# Patient Record
Sex: Male | Born: 1960 | Race: White | Hispanic: No | Smoking: Former smoker
Health system: Southern US, Community
[De-identification: ages and names within clinical notes are randomized; demographics above are authoritative.]

## PROBLEM LIST (undated history)

## (undated) DIAGNOSIS — K219 Gastro-esophageal reflux disease without esophagitis: Secondary | ICD-10-CM

## (undated) HISTORY — DX: Gastro-esophageal reflux disease without esophagitis: K21.9

---

## 2021-02-12 DIAGNOSIS — Z139 Encounter for screening, unspecified: Secondary | ICD-10-CM

## 2021-02-12 LAB — GLUCOSE, POCT (MANUAL RESULT ENTRY): POC Glucose: 107 mg/dl — AB (ref 70–99)

## 2021-02-12 NOTE — Congregational Nurse Program (Signed)
Dept: [phone]   Congregational Nurse Program Note  Date of Encounter: 02/12/2021  Past Medical History: No past medical history on file.  Encounter Details:  CNP Questionnaire - 02/12/21 1000      Questionnaire   Do you give verbal consent to treat you today? Yes    Visit Setting Other    Location Patient Served At [facility]    Patient Status Not Applicable    Medical Provider No    Insurance Uninsured (Includes [facility] [place])    Intervention Advocate;Educate;Refer    Food Within past 12 months, worried food would run out with no money to buy more;Within past 12 months, food ran out with no money to buy more    Referrals Behavioral/Mental Health Provider;Medication Assistance;[facility];PCP - other provider    Screening Referrals Annual Wellness Visit    ED Visit Averted Yes           Client into [facility] today to enroll in [facility], seeking a primary care provider. He recently moved to area a month ago, he is unemployed but states looking for work. He states he has not been to a primary care provider "ever". He does admit emergency room visit 2 years ago for Covid. He is not vaccinated.  Admits to past admissions for mental health last per client 3 years ago.  Past Medical History per client: Hypertension ( no medication ever) Depression GERD (takes over the counter med for acid reflux..unsure of name) History of Alcohol abuse: last alcohol 5 1/2 years ago. Fractured right wrist  Facial fracture left side Joint pain and stiffness  Difficult historian Medications: OTC acid reflux medication   Alert and oriented to person and place, answers appropriately. Client is very hard of hearing. Appears nervous. Gait steady, but reports stiffness and difficulty moving after sitting a period of time. Complaint today; nephew took blood pressure at home and it was "200 over 100 something" Also complains of "problem with my butt" states  he has sores and areas that bleed, denies hemorrhoids. He states he cannot use toilet paper, uses wipes. Notices some blood when he wipes.He reports normal bowel movements, but "never looks at stool, I flush before getting up" he also complains of gas and "very bad smelling bowel movements" He denies chest pains at present, but does reports he has some tightness at times and at some point was told be ER he needed to see a cardiologist, but he does not know why. He reports dyspnea on exertion but no wheezing. 40 plus year smoker quit about 2 years ago, but still uses smokeless tobacco.  Lungs clear, some diminished breath sounds bases. O2 saturation 97% on room air, resp 12 and unlabored at rest. Denies swelling of hands and feet.  Denies headache but does report difficulty with vision reading. He does have some tingling and difficulty holding objects in right hand since wrist fracture, right wrist does appear with deformity, client reports, "they didn't put it back together right". Denies abdominal pain at present.  He reports history of depression and anger problems. He reports being admitted for past suicidal ideations and attempts, last being 3 years ago. Today he denies have SI/HI but admits he does need to be back on medications. He states when he was admitted he was never given prescriptions and same for any ER visits in past. Discussed Mental health services at [facility] gave dates and times for walk in intake M-F and address. Also provided crisis number  as well as Mobile crisis number and discussed that is available 24/7. Client reports he is looking for work, but states he has a "short fuse" and has difficulty at times working with others.  Today: Blood pressure left arm, sitting normal cuff 159/106, pulse91 Oxygen saturation 97, Resp 12, Temp 98.3 orally. Random blood sugar client last ate cereal around 830am  CBG: 107   He does report family history of heart disease and strokes. Discussed signs  and symptoms of stroke and instructed to call 911 for any of those symptoms or chest pain or increased shortness of breath.   Plan: [facility] for 02/13/21 at 0830am client given contact information and address.  Referral to [facility] Services: given address and walk in intake times and days available. Client given crisis numbers.  Plan to follow up after [facility] appointment and follow up regarding getting mental health services.  Care Connect application completed and medassist application sent in today by Eston Mould.   Client agreeable to plan. This RN's contact information given to client.   Francee Nodal RN [employer]

## 2021-02-13 ENCOUNTER — Encounter: Payer: Self-pay | Admitting: Physician Assistant

## 2021-02-13 ENCOUNTER — Other Ambulatory Visit: Payer: Self-pay

## 2021-02-13 ENCOUNTER — Ambulatory Visit: Payer: Self-pay | Admitting: Physician Assistant

## 2021-02-13 VITALS — BP 145/100 | HR 91 | Temp 98.2°F | Ht 66.75 in | Wt 177.0 lb

## 2021-02-13 DIAGNOSIS — Z131 Encounter for screening for diabetes mellitus: Secondary | ICD-10-CM

## 2021-02-13 DIAGNOSIS — I10 Essential (primary) hypertension: Secondary | ICD-10-CM

## 2021-02-13 DIAGNOSIS — R7309 Other abnormal glucose: Secondary | ICD-10-CM

## 2021-02-13 DIAGNOSIS — K602 Anal fissure, unspecified: Secondary | ICD-10-CM

## 2021-02-13 DIAGNOSIS — K625 Hemorrhage of anus and rectum: Secondary | ICD-10-CM

## 2021-02-13 DIAGNOSIS — Z125 Encounter for screening for malignant neoplasm of prostate: Secondary | ICD-10-CM

## 2021-02-13 DIAGNOSIS — Z13220 Encounter for screening for lipoid disorders: Secondary | ICD-10-CM

## 2021-02-13 DIAGNOSIS — Z7689 Persons encountering health services in other specified circumstances: Secondary | ICD-10-CM

## 2021-02-13 DIAGNOSIS — K219 Gastro-esophageal reflux disease without esophagitis: Secondary | ICD-10-CM

## 2021-02-13 MED ORDER — LISINOPRIL 10 MG PO TABS
10.0000 mg | ORAL_TABLET | Freq: Every day | ORAL | 1 refills | Status: DC
Start: 2021-02-13 — End: 2021-03-12

## 2021-02-13 MED ORDER — OMEPRAZOLE 40 MG PO CPDR: 40.0000 mg | DELAYED_RELEASE_CAPSULE | Freq: Every day | ORAL | 1 refills | Status: DC

## 2021-02-13 NOTE — Progress Notes (Signed)
BP (!) 145/100   Pulse 91   Temp 98.2 F (36.8 C)   Ht 5' 6.75" (1.695 m)   Wt 177 lb (80.3 kg)   SpO2 99%   BMI 27.93 kg/m    Subjective:    Patient ID: Trevor Allen, male    DOB: 1961/05/29, 60 y.o.   MRN: [id number]  HPI: Trevor Allen is a 61 y.o. male presenting on 02/13/2021 for No chief complaint on file.   HPI   Pt had a negative covid 19 screening questionnaire.   Pt is a 59yoM who presents to establish care.  He has not had a PCP in a long time.  Pt c/o heartburn that has been bothering him for 4 or 5 years.  He uses OTC acid reducer and has problems if he misses his dose.  He had bleeding ulcers in the 1990s.  He thinks his BP has been high or 2 or 3 years.  He doesn't work.  He last worked about 2 1/2 years ago.  He worked on [employer].  He recently moved back to [place].  Pt c/o rectum problems for about a year.  His BMs are normal most of the time.  He does have times of constipation and diarrhea.  He has blood sometimes when he cleans himself.  He has never had a colonoscopy.    Relevant past medical, surgical, family and social history reviewed and updated as indicated. Interim medical history since our last visit reviewed. Allergies and medications reviewed and updated.  CURRENT MEDS: OTC acid reducer  Review of Systems  Per HPI unless specifically indicated above     Objective:    BP (!) 145/100   Pulse 91   Temp 98.2 F (36.8 C)   Ht 5' 6.75" (1.695 m)   Wt 177 lb (80.3 kg)   SpO2 99%   BMI 27.93 kg/m   Wt Readings from Last 3 Encounters:  02/13/21 177 lb (80.3 kg)    Physical Exam Vitals reviewed. Exam conducted with a chaperone present (c.vaughn).  Constitutional:      Appearance: He is well-developed.  HENT:     Head: Normocephalic and atraumatic.     Right Ear: Tympanic membrane and ear canal normal.     Left Ear: Tympanic membrane and ear canal normal.     Ears:     Comments: Pt is HOH Eyes:     Extraocular  Movements: Extraocular movements intact.     Conjunctiva/sclera: Conjunctivae normal.     Pupils: Pupils are equal, round, and reactive to light.  Neck:     Thyroid: No thyromegaly.  Cardiovascular:     Rate and Rhythm: Normal rate and regular rhythm.  Pulmonary:     Effort: Pulmonary effort is normal.     Breath sounds: Normal breath sounds. No wheezing or rales.  Abdominal:     General: Bowel sounds are normal.     Palpations: Abdomen is soft. There is no hepatomegaly, splenomegaly, mass or pulsatile mass.     Tenderness: There is no abdominal tenderness. There is no guarding or rebound.     Hernia: A hernia is present. Hernia is present in the umbilical area.  Genitourinary:    Rectum: Tenderness and anal fissure present. No external hemorrhoid.  Musculoskeletal:     Cervical back: Neck supple.     Right lower leg: No edema.     Left lower leg: No edema.  Lymphadenopathy:     Cervical: No cervical adenopathy.  Skin:    General: Skin is warm and dry.     Findings: No rash.  Neurological:     Mental Status: He is alert and oriented to person, place, and time.  Psychiatric:        Attention and Perception: Attention normal.        Speech: Speech normal.        Behavior: Behavior normal. Behavior is cooperative.             Assessment & Plan:   Encounter Diagnoses  Name Primary?  . Encounter to establish care Yes  . Primary hypertension   . Gastroesophageal reflux disease, unspecified whether esophagitis present   . Rectal bleeding   . Screening cholesterol level   . Screening for prostate cancer   . Screening for diabetes mellitus   . Elevated glucose      -will get Baseline labs -pt will start lisinopril for htn -rx omeprazole for GERD.  Pt is counseled on lifestyle changes for gerd and given handout.  Will Refer to GI.  Concerning for possible rectal cancer or some other issue in light of the length of his problems. Pt was counseled on skin care and the  need to make sure he keeps the area dry.  He is also counseled to avoid constipation with drinking plenty of water and using OTC stool softener if needed. -pt is given application for [facility] [employer] financial assistance.   -pt is encouraged to get covid vaccination -pt will follow up 1 month to recheck bp and review labs.  He is to contact office sooner prn

## 2021-02-13 NOTE — Patient Instructions (Signed)
[employer]'s Current Therapy 2021 (pp. 213-216). [place], [place]: Elsevier.">  Gastroesophageal Reflux Disease, Adult Gastroesophageal reflux (GER) happens when acid from the stomach flows up into the tube that connects the mouth and the stomach (esophagus). Normally, food travels down the esophagus and stays in the stomach to be digested. However, when a person has GER, food and stomach acid sometimes move back up into the esophagus. If this becomes a more serious problem, the person may be diagnosed with a disease called gastroesophageal reflux disease (GERD). GERD occurs when the reflux:  Happens often.  Causes frequent or severe symptoms.  Causes problems such as damage to the esophagus. When stomach acid comes in contact with the esophagus, the acid may cause inflammation in the esophagus. Over time, GERD may create small holes (ulcers) in the lining of the esophagus. What are the causes? This condition is caused by a problem with the muscle between the esophagus and the stomach (lower esophageal sphincter, or LES). Normally, the LES muscle closes after food passes through the esophagus to the stomach. When the LES is weakened or abnormal, it does not close properly, and that allows food and stomach acid to go back up into the esophagus. The LES can be weakened by certain dietary substances, medicines, and medical conditions, including:  Tobacco use.  Pregnancy.  Having a hiatal hernia.  Alcohol use.  Certain foods and beverages, such as coffee, chocolate, onions, and peppermint. What increases the risk? You are more likely to develop this condition if you:  Have an increased body weight.  Have a connective tissue disorder.  Take NSAIDs, such as ibuprofen. What are the signs or symptoms? Symptoms of this condition include:  Heartburn.  Difficult or painful swallowing and the feeling of having a lump in the throat.  A bitter taste in the mouth.  Bad breath and having a large  amount of saliva.  Having an upset or bloated stomach and belching.  Chest pain. Different conditions can cause chest pain. Make sure you see your health care provider if you experience chest pain.  Shortness of breath or wheezing.  Ongoing (chronic) cough or a nighttime cough.  Wearing away of tooth enamel.  Weight loss. How is this diagnosed? This condition may be diagnosed based on a medical history and a physical exam. To determine if you have mild or severe GERD, your health care provider may also monitor how you respond to treatment. You may also have tests, including:  A test to examine your stomach and esophagus with a small camera (endoscopy).  A test that measures the acidity level in your esophagus.  A test that measures how much pressure is on your esophagus.  A barium swallow or modified barium swallow test to show the shape, size, and functioning of your esophagus. How is this treated? Treatment for this condition may vary depending on how severe your symptoms are. Your health care provider may recommend:  Changes to your diet.  Medicine.  Surgery. The goal of treatment is to help relieve your symptoms and to prevent complications. Follow these instructions at home: Eating and drinking  Follow a diet as recommended by your health care provider. This may involve avoiding foods and drinks such as: ? Coffee and tea, with or without caffeine. ? Drinks that contain alcohol. ? Energy drinks and sports drinks. ? Carbonated drinks or sodas. ? Chocolate and cocoa. ? Peppermint and mint flavorings. ? Garlic and onions. ? Horseradish. ? Spicy and acidic foods, including peppers, chili powder,   curry powder, vinegar, hot sauces, and barbecue sauce. ? Citrus fruit juices and citrus fruits, such as oranges, lemons, and limes. ? Tomato-based foods, such as red sauce, chili, salsa, and pizza with red sauce. ? Fried and fatty foods, such as donuts, french fries, potato  chips, and high-fat dressings. ? High-fat meats, such as hot dogs and fatty cuts of red and white meats, such as rib eye steak, sausage, ham, and bacon. ? High-fat dairy items, such as whole milk, butter, and cream cheese.  Eat small, frequent meals instead of large meals.  Avoid drinking large amounts of liquid with your meals.  Avoid eating meals during the 2-3 hours before bedtime.  Avoid lying down right after you eat.  Do not exercise right after you eat.   Lifestyle  Do not use any products that contain nicotine or tobacco. These products include cigarettes, chewing tobacco, and vaping devices, such as e-cigarettes. If you need help quitting, ask your health care provider.  Try to reduce your stress by using methods such as yoga or meditation. If you need help reducing stress, ask your health care provider.  If you are overweight, reduce your weight to an amount that is healthy for you. Ask your health care provider for guidance about a safe weight loss goal.   General instructions  Pay attention to any changes in your symptoms.  Take over-the-counter and prescription medicines only as told by your health care provider. Do not take aspirin, ibuprofen, or other NSAIDs unless your health care provider told you to take these medicines.  Wear loose-fitting clothing. Do not wear anything tight around your waist that causes pressure on your abdomen.  Raise (elevate) the head of your bed about 6 inches (15 cm). You can use a wedge to do this.  Avoid bending over if this makes your symptoms worse.  Keep all follow-up visits. This is important. Contact a health care provider if:  You have: ? New symptoms. ? Unexplained weight loss. ? Difficulty swallowing or it hurts to swallow. ? Wheezing or a persistent cough. ? A hoarse voice.  Your symptoms do not improve with treatment. Get help right away if:  You have sudden pain in your arms, neck, jaw, teeth, or back.  You  suddenly feel sweaty, dizzy, or light-headed.  You have chest pain or shortness of breath.  You vomit and the vomit is green, yellow, or black, or it looks like blood or coffee grounds.  You faint.  You have stool that is red, bloody, or black.  You cannot swallow, drink, or eat. These symptoms may represent a serious problem that is an emergency. Do not wait to see if the symptoms will go away. Get medical help right away. Call your local emergency services (911 in the [place] Do not drive yourself to the hospital. Summary  Gastroesophageal reflux happens when acid from the stomach flows up into the esophagus. GERD is a disease in which the reflux happens often, causes frequent or severe symptoms, or causes problems such as damage to the esophagus.  Treatment for this condition may vary depending on how severe your symptoms are. Your health care provider may recommend diet and lifestyle changes, medicine, or surgery.  Contact a health care provider if you have new or worsening symptoms.  Take over-the-counter and prescription medicines only as told by your health care provider. Do not take aspirin, ibuprofen, or other NSAIDs unless your health care provider told you to do so.  Keep all follow-up   visits as told by your health care provider. This is important. This information is not intended to replace advice given to you by your health care provider. Make sure you discuss any questions you have with your health care provider. Document Revised: 04/15/2020 Document Reviewed: 04/15/2020 Elsevier Patient Education  2021 Elsevier Inc.  

## 2021-02-14 ENCOUNTER — Other Ambulatory Visit: Payer: Self-pay

## 2021-02-14 ENCOUNTER — Other Ambulatory Visit (HOSPITAL_COMMUNITY)
Admission: RE | Admit: 2021-02-14 | Discharge: 2021-02-14 | Disposition: A | Discharge status: Home / Self Care | Payer: Self-pay | Source: Ambulatory Visit | Attending: Physician Assistant | Admitting: Physician Assistant

## 2021-02-14 DIAGNOSIS — K625 Hemorrhage of anus and rectum: Secondary | ICD-10-CM

## 2021-02-14 DIAGNOSIS — Z131 Encounter for screening for diabetes mellitus: Secondary | ICD-10-CM

## 2021-02-14 DIAGNOSIS — Z13220 Encounter for screening for lipoid disorders: Secondary | ICD-10-CM

## 2021-02-14 DIAGNOSIS — R7309 Other abnormal glucose: Secondary | ICD-10-CM

## 2021-02-14 DIAGNOSIS — I10 Essential (primary) hypertension: Secondary | ICD-10-CM

## 2021-02-14 DIAGNOSIS — Z125 Encounter for screening for malignant neoplasm of prostate: Secondary | ICD-10-CM

## 2021-02-14 DIAGNOSIS — K219 Gastro-esophageal reflux disease without esophagitis: Secondary | ICD-10-CM

## 2021-02-14 LAB — COMPREHENSIVE METABOLIC PANEL
ALT: 27 U/L (ref 0–44)
AST: 21 U/L (ref 15–41)
Albumin: 4.1 g/dL (ref 3.5–5.0)
Alkaline Phosphatase: 84 U/L (ref 38–126)
Anion gap: 8 (ref 5–15)
BUN: 23 mg/dL — ABNORMAL HIGH (ref 6–20)
CO2: 26 mmol/L (ref 22–32)
Calcium: 9.7 mg/dL (ref 8.9–10.3)
Chloride: 99 mmol/L (ref 98–111)
Creatinine, Ser: 1.04 mg/dL (ref 0.61–1.24)
GFR, Estimated: >60 mL/min (ref >60)
Glucose, Bld: 90 mg/dL (ref 70–99)
Potassium: 4 mmol/L (ref 3.5–5.1)
Sodium: 133 mmol/L — ABNORMAL LOW (ref 135–145)
Total Bilirubin: 0.4 mg/dL (ref 0.3–1.2)
Total Protein: 8 g/dL (ref 6.5–8.1)

## 2021-02-14 LAB — CBC
HCT: 46.1 % (ref 39.0–52.0)
Hemoglobin: 15.7 g/dL (ref 13.0–17.0)
MCH: 31.7 pg (ref 26.0–34.0)
MCHC: 34.1 g/dL (ref 30.0–36.0)
MCV: 93.1 fL (ref 80.0–100.0)
Platelets: 321 10*3/uL (ref 150–400)
RBC: 4.95 MIL/uL (ref 4.22–5.81)
RDW: 11.9 % (ref 11.5–15.5)
WBC: 9.2 10*3/uL (ref 4.0–10.5)
nRBC: 0 % (ref 0.0–0.2)

## 2021-02-14 LAB — HEMOGLOBIN A1C
Hgb A1c MFr Bld: 5.7 % — ABNORMAL HIGH (ref 4.8–5.6)
Mean Plasma Glucose: 116.89 mg/dL

## 2021-02-14 LAB — LIPID PANEL
Cholesterol: 206 mg/dL — ABNORMAL HIGH (ref 0–200)
HDL: 47 mg/dL (ref >40)
LDL Cholesterol: 139 mg/dL — ABNORMAL HIGH (ref 0–99)
Total CHOL/HDL Ratio: 4.4 RATIO
Triglycerides: 99 mg/dL (ref <150)
VLDL: 20 mg/dL (ref 0–40)

## 2021-02-14 LAB — PSA: Prostatic Specific Antigen: 1.28 ng/mL (ref 0.00–4.00)

## 2021-02-17 ENCOUNTER — Ambulatory Visit: Payer: Self-pay | Admitting: Physician Assistant

## 2021-02-18 ENCOUNTER — Encounter: Payer: Self-pay | Admitting: Internal Medicine

## 2021-03-12 ENCOUNTER — Encounter: Payer: Self-pay | Admitting: Physician Assistant

## 2021-03-12 ENCOUNTER — Ambulatory Visit: Payer: Self-pay | Admitting: Physician Assistant

## 2021-03-12 VITALS — BP 140/95 | HR 87 | Temp 96.7°F

## 2021-03-12 DIAGNOSIS — E785 Hyperlipidemia, unspecified: Secondary | ICD-10-CM

## 2021-03-12 DIAGNOSIS — I10 Essential (primary) hypertension: Secondary | ICD-10-CM

## 2021-03-12 MED ORDER — LISINOPRIL 20 MG PO TABS: 20.0000 mg | ORAL_TABLET | Freq: Every day | ORAL | 3 refills | Status: DC

## 2021-03-12 MED ORDER — ATORVASTATIN CALCIUM 20 MG PO TABS: 20.0000 mg | ORAL_TABLET | Freq: Every day | ORAL | 3 refills | Status: DC

## 2021-03-12 NOTE — Patient Instructions (Signed)
High Cholesterol  High cholesterol is a condition in which the blood has high levels of a white, waxy substance similar to fat (cholesterol). The liver makes all the cholesterol that the body needs. The human body needs small amounts of cholesterol to help build cells. A person gets extra or excess cholesterol from the food that he or she eats. The blood carries cholesterol from the liver to the rest of the body. If you have high cholesterol, deposits (plaques) may build up on the walls of your arteries. Arteries are the blood vessels that carry blood away from your heart. These plaques make the arteries narrow and stiff. Cholesterol plaques increase your risk for heart attack and stroke. Work with your health care provider to keep your cholesterol levels in a healthy range. What increases the risk? The following factors may make you more likely to develop this condition:  Eating foods that are high in animal fat (saturated fat) or cholesterol.  Being overweight.  Not getting enough exercise.  A family history of high cholesterol (familial hypercholesterolemia).  Use of tobacco products.  Having diabetes. What are the signs or symptoms? There are no symptoms of this condition. How is this diagnosed? This condition may be diagnosed based on the results of a blood test.  If you are older than 59 years of age, your health care provider may check your cholesterol levels every 4-6 years.  You may be checked more often if you have high cholesterol or other risk factors for heart disease. The blood test for cholesterol measures:  "Bad" cholesterol, or LDL cholesterol. This is the main type of cholesterol that causes heart disease. The desired level is less than 100 mg/dL.  "Good" cholesterol, or HDL cholesterol. HDL helps protect against heart disease by cleaning the arteries and carrying the LDL to the liver for processing. The desired level for HDL is 60 mg/dL or higher.  Triglycerides.  These are fats that your body can store or burn for energy. The desired level is less than 150 mg/dL.  Total cholesterol. This measures the total amount of cholesterol in your blood and includes LDL, HDL, and triglycerides. The desired level is less than 200 mg/dL. How is this treated? This condition may be treated with:  Diet changes. You may be asked to eat foods that have more fiber and less saturated fats or added sugar.  Lifestyle changes. These may include regular exercise, maintaining a healthy weight, and quitting use of tobacco products.  Medicines. These are given when diet and lifestyle changes have not worked. You may be prescribed a statin medicine to help lower your cholesterol levels. Follow these instructions at home: Eating and drinking  Eat a healthy, balanced diet. This diet includes: ? Daily servings of a variety of fresh, frozen, or canned fruits and vegetables. ? Daily servings of whole grain foods that are rich in fiber. ? Foods that are low in saturated fats and trans fats. These include poultry and fish without skin, lean cuts of meat, and low-fat dairy products. ? A variety of fish, especially oily fish that contain omega-3 fatty acids. Aim to eat fish at least 2 times a week.  Avoid foods and drinks that have added sugar.  Use healthy cooking methods, such as roasting, grilling, broiling, baking, poaching, steaming, and stir-frying. Do not fry your food except for stir-frying.   Lifestyle  Get regular exercise. Aim to exercise for a total of 150 minutes a week. Increase your activity level by doing activities   such as gardening, walking, and taking the stairs.  Do not use any products that contain nicotine or tobacco, such as cigarettes, e-cigarettes, and chewing tobacco. If you need help quitting, ask your health care provider.   General instructions  Take over-the-counter and prescription medicines only as told by your health care provider.  Keep all  follow-up visits as told by your health care provider. This is important. Where to find more information  American Heart Association: www.heart.org  National Heart, Lung, and Blood Institute: www.nhlbi.nih.gov Contact a health care provider if:  You have trouble achieving or maintaining a healthy diet or weight.  You are starting an exercise program.  You are unable to stop smoking. Get help right away if:  You have chest pain.  You have trouble breathing.  You have any symptoms of a stroke. "BE FAST" is an easy way to remember the main warning signs of a stroke: ? B - Balance. Signs are dizziness, sudden trouble walking, or loss of balance. ? E - Eyes. Signs are trouble seeing or a sudden change in vision. ? F - Face. Signs are sudden weakness or numbness of the face, or the face or eyelid drooping on one side. ? A - Arms. Signs are weakness or numbness in an arm. This happens suddenly and usually on one side of the body. ? S - Speech. Signs are sudden trouble speaking, slurred speech, or trouble understanding what people say. ? T - Time. Time to call emergency services. Write down what time symptoms started.  You have other signs of a stroke, such as: ? A sudden, severe headache with no known cause. ? Nausea or vomiting. ? Seizure. These symptoms may represent a serious problem that is an emergency. Do not wait to see if the symptoms will go away. Get medical help right away. Call your local emergency services (911 in the [place] Do not drive yourself to the hospital. Summary  Cholesterol plaques increase your risk for heart attack and stroke. Work with your health care provider to keep your cholesterol levels in a healthy range.  Eat a healthy, balanced diet, get regular exercise, and maintain a healthy weight.  Do not use any products that contain nicotine or tobacco, such as cigarettes, e-cigarettes, and chewing tobacco.  Get help right away if you have any symptoms of a  stroke. This information is not intended to replace advice given to you by your health care provider. Make sure you discuss any questions you have with your health care provider. Document Revised: 09/04/2019 Document Reviewed: 09/04/2019 Elsevier Patient Education  2021 Elsevier Inc.  

## 2021-03-12 NOTE — Progress Notes (Signed)
BP (!) 140/95   Pulse 87   Temp (!) 96.7 F (35.9 C)   SpO2 98%    Subjective:    Patient ID: Trevor Allen, male    DOB: February 12, 1961, 60 y.o.   MRN: [id number]  HPI: Trevor Allen is a 60 y.o. male presenting on 03/12/2021 for No chief complaint on file.   HPI   Pt had a negative covid 19 screening questionnaire.    Pt is 59yoM who presented to office last month to establish care.    He says he submitted his [employer] financial assistance application to [facility].  He was referred to GI at his appointment due to rectal bleeding.    He has appointment with GI in June.      Relevant past medical, surgical, family and social history reviewed and updated as indicated. Interim medical history since our last visit reviewed. Allergies and medications reviewed and updated.   Current Outpatient Medications:  .  lisinopril (ZESTRIL) 10 MG tablet, Take 1 tablet (10 mg total) by mouth daily., Disp: 30 tablet, Rfl: 1 .  omeprazole (PRILOSEC) 40 MG capsule, Take 1 capsule (40 mg total) by mouth daily., Disp: 30 capsule, Rfl: 1     Review of Systems  Per HPI unless specifically indicated above     Objective:    BP (!) 140/95   Pulse 87   Temp (!) 96.7 F (35.9 C)   SpO2 98%   Wt Readings from Last 3 Encounters:  02/13/21 177 lb (80.3 kg)    Physical Exam Vitals reviewed.  Constitutional:      General: He is not in acute distress.    Appearance: He is well-developed. He is not toxic-appearing.  HENT:     Head: Normocephalic and atraumatic.  Cardiovascular:     Rate and Rhythm: Normal rate and regular rhythm.  Pulmonary:     Effort: Pulmonary effort is normal.     Breath sounds: Normal breath sounds. No wheezing.  Abdominal:     General: Bowel sounds are normal.     Palpations: Abdomen is soft.     Tenderness: There is no abdominal tenderness.  Musculoskeletal:     Cervical back: Neck supple.     Right lower leg: No edema.     Left lower leg: No edema.   Lymphadenopathy:     Cervical: No cervical adenopathy.  Skin:    General: Skin is warm and dry.  Neurological:     Mental Status: He is alert and oriented to person, place, and time.  Psychiatric:        Behavior: Behavior normal.     Results for orders placed or performed during the hospital encounter of 02/14/21  Hemoglobin A1c  Result Value Ref Range   Hgb A1c MFr Bld 5.7 (H) 4.8 - 5.6 %   Mean Plasma Glucose 116.89 mg/dL  PSA  Result Value Ref Range   Prostatic Specific Antigen 1.28 0.00 - 4.00 ng/mL  Lipid panel  Result Value Ref Range   Cholesterol 206 (H) 0 - 200 mg/dL   Triglycerides 99 <283 mg/dL   HDL 47 >66 mg/dL   Total CHOL/HDL Ratio 4.4 RATIO   VLDL 20 0 - 40 mg/dL   LDL Cholesterol 294 (H) 0 - 99 mg/dL  CBC  Result Value Ref Range   WBC 9.2 4.0 - 10.5 K/uL   RBC 4.95 4.22 - 5.81 MIL/uL   Hemoglobin 15.7 13.0 - 17.0 g/dL   HCT 76.5 46.5 -  52.0 %   MCV 93.1 80.0 - 100.0 fL   MCH 31.7 26.0 - 34.0 pg   MCHC 34.1 30.0 - 36.0 g/dL   RDW 00.7 62.2 - 63.3 %   Platelets 321 150 - 400 K/uL   nRBC 0.0 0.0 - 0.2 %  Comprehensive metabolic panel  Result Value Ref Range   Sodium 133 (L) 135 - 145 mmol/L   Potassium 4.0 3.5 - 5.1 mmol/L   Chloride 99 98 - 111 mmol/L   CO2 26 22 - 32 mmol/L   Glucose, Bld 90 70 - 99 mg/dL   BUN 23 (H) 6 - 20 mg/dL   Creatinine, Ser 3.54 0.61 - 1.24 mg/dL   Calcium 9.7 8.9 - 56.2 mg/dL   Total Protein 8.0 6.5 - 8.1 g/dL   Albumin 4.1 3.5 - 5.0 g/dL   AST 21 15 - 41 U/L   ALT 27 0 - 44 U/L   Alkaline Phosphatase 84 38 - 126 U/L   Total Bilirubin 0.4 0.3 - 1.2 mg/dL   GFR, Estimated >56 >38 mL/min   Anion gap 8 5 - 15      Assessment & Plan:    Encounter Diagnoses  Name Primary?  . Primary hypertension Yes  . Hyperlipidemia, unspecified hyperlipidemia type      -reviewed labs with pt -will Increase lisinopril for HTN -will Add atorvastatin.  Pt was counseled on cholesterol and lowfat diet.  He was given reading  information -pt was educated and encouraged to Get covid vaccination -pt to GI next month as scheduled -pt to follow up 3 months.  He is to contact office sooner prn

## 2021-04-17 ENCOUNTER — Encounter: Payer: Self-pay | Admitting: Internal Medicine

## 2021-04-17 ENCOUNTER — Ambulatory Visit (INDEPENDENT_AMBULATORY_CARE_PROVIDER_SITE_OTHER): Payer: Self-pay | Admitting: Internal Medicine

## 2021-04-17 ENCOUNTER — Other Ambulatory Visit: Payer: Self-pay

## 2021-04-17 VITALS — BP 137/84 | HR 96 | Temp 97.1°F | Ht 67.0 in | Wt 185.6 lb

## 2021-04-17 DIAGNOSIS — K625 Hemorrhage of anus and rectum: Secondary | ICD-10-CM

## 2021-04-17 DIAGNOSIS — R12 Heartburn: Secondary | ICD-10-CM

## 2021-04-17 DIAGNOSIS — R194 Change in bowel habit: Secondary | ICD-10-CM

## 2021-04-17 NOTE — Progress Notes (Signed)
Primary Care Physician:  Jacquelin Hawking, PA-C Primary Gastroenterologist:  Dr. Marletta Lor  Chief Complaint  Patient presents with   Gastroesophageal Reflux   Gas   Anal Fissure    Approx 1 year. Rectal itching. Foul odor. Never had tcs. No fhcrc   Constipation    Alternates with diarrhea    HPI:   Trevor Allen is a 60 y.o. male who presents to clinic today by referral from his PCP Jacquelin Hawking.  He has multiple GI complaints for me.  States she has uncontrolled acid reflux and heartburn symptoms.  Was previously taking omeprazole 40 mg daily which worked well though he cannot afford prescription of $15 a month for this.  He now takes over-the-counter 20 mg daily and has breakthrough symptoms frequently.  No dysphagia odynophagia.  No epigastric pain.  No melena.  Patient has never had a colonoscopy before.  Denies any family history of colorectal malignancy.  Does note frequent rectal bleeding which she attributes to hemorrhoids.  States his rectum is frequently raw from wiping.  Bowel movements are also not consistent.  States he fluctuates between constipation and diarrhea.  States is primarily 50-50.  He will have 2 to 3 days of constipation followed by 2 to 3 days of diarrhea.  No abdominal pain.  Past Medical History:  Diagnosis Date   GERD (gastroesophageal reflux disease)     No past surgical history on file.  Current Outpatient Medications  Medication Sig Dispense Refill   atorvastatin (LIPITOR) 20 MG tablet Take 1 tablet (20 mg total) by mouth daily. 30 tablet 3   lisinopril (ZESTRIL) 20 MG tablet Take 1 tablet (20 mg total) by mouth daily. 30 tablet 3   omeprazole (PRILOSEC) 20 MG capsule Take 20 mg by mouth daily.     omeprazole (PRILOSEC) 40 MG capsule Take 1 capsule (40 mg total) by mouth daily. (Patient not taking: Reported on 04/17/2021) 30 capsule 1   No current facility-administered medications for this visit.    Allergies as of 04/17/2021   (No Known  Allergies)    Family History  Problem Relation Age of Onset   Aneurysm Mother    Heart disease Father    Stroke Brother     Social History   Socioeconomic History   Marital status: Divorced    Spouse name: Not on file   Number of children: Not on file   Years of education: Not on file   Highest education level: Not on file  Occupational History   Not on file  Tobacco Use   Smoking status: Former    Pack years: 0.00    Types: Cigarettes    Quit date: 2020    Years since quitting: 2.4   Smokeless tobacco: Current    Types: Snuff  Vaping Use   Vaping Use: Never used  Substance and Sexual Activity   Alcohol use: Not Currently    Comment: alcoholic- none since 2020   Drug use: Not Currently    Comment: none since 2012   Sexual activity: Not on file  Other Topics Concern   Not on file  Social History Narrative   Not on file   Social Determinants of Health   Financial Resource Strain: Not on file  Food Insecurity: Not on file  Transportation Needs: Not on file  Physical Activity: Not on file  Stress: Not on file  Social Connections: Not on file  Intimate Partner Violence: Not on file    Subjective: Review of  Systems  Constitutional:  Negative for chills and fever.  HENT:  Negative for congestion and hearing loss.   Eyes:  Negative for blurred vision and double vision.  Respiratory:  Negative for cough and shortness of breath.   Cardiovascular:  Negative for chest pain and palpitations.  Gastrointestinal:  Positive for blood in stool and heartburn. Negative for abdominal pain, constipation, diarrhea, melena and vomiting.  Genitourinary:  Negative for dysuria and urgency.  Musculoskeletal:  Negative for joint pain and myalgias.  Skin:  Negative for itching and rash.  Neurological:  Negative for dizziness and headaches.  Psychiatric/Behavioral:  Negative for depression. The patient is not nervous/anxious.       Objective: BP 137/84   Pulse 96   Temp (!)  97.1 F (36.2 C) (Temporal)   Ht 5\' 7"  (1.702 m)   Wt 185 lb 9.6 oz (84.2 kg)   BMI 29.07 kg/m  Physical Exam Constitutional:      Appearance: Normal appearance.  HENT:     Head: Normocephalic and atraumatic.  Eyes:     Extraocular Movements: Extraocular movements intact.     Conjunctiva/sclera: Conjunctivae normal.  Cardiovascular:     Rate and Rhythm: Normal rate and regular rhythm.  Pulmonary:     Effort: Pulmonary effort is normal.     Breath sounds: Normal breath sounds.  Abdominal:     General: Bowel sounds are normal.     Palpations: Abdomen is soft.  Musculoskeletal:        General: Normal range of motion.     Cervical back: Normal range of motion and neck supple.  Skin:    General: Skin is warm.  Neurological:     General: No focal deficit present.     Mental Status: He is alert and oriented to person, place, and time.  Psychiatric:        Mood and Affect: Mood normal.        Behavior: Behavior normal.     Assessment: *Heartburn-not well controlled on omeprazole 20 mg daily *Rectal bleeding *Change in bowel habit  Plan: Patient's heartburn not well controlled on omeprazole 20 mg daily.  He states he cannot afford omeprazole 40 mg daily as it would cost him $15 a month.  He does state that this dose helped him much more than the over-the-counter.  Discussed that we could attempt to get his medication through [facility] is a 90-day supply only costs $6.60.  He states he does not have an email address and to discuss further with his sister to set this up.  We will attempt to get in touch with her to set this up.  In regards to his change in bowel habits and rectal bleeding, likely IBS as well as bleeding from hemorrhoids although will need colonoscopy as he is 78 and has never had 1 before.  He is currently in the process of applying for [place] assistance program has not gotten official word yet on whether he has been approved.  He states he will go  by the office today and check on this.  Patient to call office if approved and we will set him up for colonoscopy.  Otherwise she will follow-up in 3 months.  Thank you 41 for the kind referral.  04/17/2021 2:15 PM   Disclaimer: This note was dictated with voice recognition software. Similar sounding words can inadvertently be transcribed and may not be corrected upon review.

## 2021-04-17 NOTE — Patient Instructions (Signed)
We need to get you set up for colonoscopy both for colon cancer screening purposes as well as to evaluate your rectal symptoms.  We need to ensure that you have been approved for [employer] assistance prior to doing this however as I do not want you to get stuck with a large bill for the procedure itself.  Please let us know after you go by their office if you have been approved and we will get this scheduled.  For your chronic reflux, I will attempt to use [facility] to save you money on your omeprazole.  I will discuss this further with your Sister.   Otherwise follow-up in 2 to 3 months.  At [facility] we value your feedback. You may receive a survey about your visit today. Please share your experience as we strive to create trusting relationships with our patients to provide genuine, compassionate, quality care.  We appreciate your understanding and patience as we review any laboratory studies, imaging, and other diagnostic tests that are ordered as we care for you. Our office policy is 5 business days for review of these results, and any emergent or urgent results are addressed in a timely manner for your best interest. If you do not hear from our office in 1 week, please contact us.   We also encourage the use of MyChart, which contains your medical information for your review as well. If you are not enrolled in this feature, an access code is on this after visit summary for your convenience. Thank you for allowing [place] to be involved in your care.  It was great to see you today!  I hope you have a great rest of your summer!!    Hennie Duos. Marletta Lor, D.O. Gastroenterology and Hepatology [facility]

## 2021-05-21 ENCOUNTER — Other Ambulatory Visit: Payer: Self-pay | Admitting: Physician Assistant

## 2021-05-21 DIAGNOSIS — I10 Essential (primary) hypertension: Secondary | ICD-10-CM

## 2021-05-21 DIAGNOSIS — E785 Hyperlipidemia, unspecified: Secondary | ICD-10-CM

## 2021-06-09 ENCOUNTER — Other Ambulatory Visit: Payer: Self-pay

## 2021-06-09 ENCOUNTER — Other Ambulatory Visit (HOSPITAL_COMMUNITY)
Admission: RE | Admit: 2021-06-09 | Discharge: 2021-06-09 | Disposition: A | Discharge status: Home / Self Care | Payer: Self-pay | Source: Ambulatory Visit | Attending: Physician Assistant | Admitting: Physician Assistant

## 2021-06-09 DIAGNOSIS — E785 Hyperlipidemia, unspecified: Secondary | ICD-10-CM

## 2021-06-09 DIAGNOSIS — I10 Essential (primary) hypertension: Secondary | ICD-10-CM

## 2021-06-09 LAB — COMPREHENSIVE METABOLIC PANEL
ALT: 42 U/L (ref 0–44)
AST: 27 U/L (ref 15–41)
Albumin: 3.9 g/dL (ref 3.5–5.0)
Alkaline Phosphatase: 76 U/L (ref 38–126)
Anion gap: 6 (ref 5–15)
BUN: 16 mg/dL (ref 6–20)
CO2: 24 mmol/L (ref 22–32)
Calcium: 9 mg/dL (ref 8.9–10.3)
Chloride: 104 mmol/L (ref 98–111)
Creatinine, Ser: 1.03 mg/dL (ref 0.61–1.24)
GFR, Estimated: >60 mL/min (ref >60)
Glucose, Bld: 114 mg/dL — ABNORMAL HIGH (ref 70–99)
Potassium: 4.3 mmol/L (ref 3.5–5.1)
Sodium: 134 mmol/L — ABNORMAL LOW (ref 135–145)
Total Bilirubin: 0.4 mg/dL (ref 0.3–1.2)
Total Protein: 7.7 g/dL (ref 6.5–8.1)

## 2021-06-09 LAB — LIPID PANEL
Cholesterol: 137 mg/dL (ref 0–200)
HDL: 39 mg/dL — ABNORMAL LOW (ref >40)
LDL Cholesterol: 82 mg/dL (ref 0–99)
Total CHOL/HDL Ratio: 3.5 RATIO
Triglycerides: 79 mg/dL (ref <150)
VLDL: 16 mg/dL (ref 0–40)

## 2021-06-11 ENCOUNTER — Encounter: Payer: Self-pay | Admitting: Physician Assistant

## 2021-06-11 ENCOUNTER — Ambulatory Visit: Payer: Self-pay | Admitting: Physician Assistant

## 2021-06-11 ENCOUNTER — Other Ambulatory Visit: Payer: Self-pay

## 2021-06-11 VITALS — BP 124/71 | HR 78 | Temp 98.1°F | Wt 182.0 lb

## 2021-06-11 DIAGNOSIS — E785 Hyperlipidemia, unspecified: Secondary | ICD-10-CM

## 2021-06-11 DIAGNOSIS — K219 Gastro-esophageal reflux disease without esophagitis: Secondary | ICD-10-CM

## 2021-06-11 DIAGNOSIS — I10 Essential (primary) hypertension: Secondary | ICD-10-CM | POA: Insufficient documentation

## 2021-06-11 MED ORDER — ATORVASTATIN CALCIUM 20 MG PO TABS: 20.0000 mg | ORAL_TABLET | Freq: Every day | ORAL | 4 refills | Status: DC

## 2021-06-11 MED ORDER — PANTOPRAZOLE SODIUM 40 MG PO TBEC: 40.0000 mg | DELAYED_RELEASE_TABLET | Freq: Every day | ORAL | 4 refills | Status: DC

## 2021-06-11 MED ORDER — ALBUTEROL SULFATE HFA 108 (90 BASE) MCG/ACT IN AERS: 2.0000 | INHALATION_SPRAY | Freq: Four times a day (QID) | RESPIRATORY_TRACT | 2 refills | Status: DC | PRN

## 2021-06-11 MED ORDER — LISINOPRIL 20 MG PO TABS: 20.0000 mg | ORAL_TABLET | Freq: Every day | ORAL | 4 refills | Status: DC

## 2021-06-11 NOTE — Patient Instructions (Signed)
Albuterol Metered Dose Inhaler (MDI) What is this medication? ALBUTEROL (al Gaspar Bidding) treats lung diseases, such as asthma, where the airways in the lungs narrow, causing breathing problems or wheezing (bronchospasm). It is also used to treat asthma or prevent breathing problems during exercise. This medication works by opening the airways of the lungs, making it easier to breathe. It is often called a rescue- or quick-reliefinhaler. This medicine may be used for other purposes; ask your health care provider orpharmacist if you have questions. COMMON BRAND NAME(S): Proair HFA, Proventil, Proventil HFA, Respirol, Ventolin,Ventolin HFA What should I tell my care team before I take this medication? They need to know if you have any of these conditions: Diabetes (high blood sugar) Heart disease High blood pressure Irregular heartbeat or rhythm Pheochromocytoma Seizures Thyroid disease An unusual or allergic reaction to albuterol, other medications, foods, dyes, or preservatives Pregnant or trying to get pregnant Breast-feeding How should I use this medication? This medication is inhaled through the mouth. Take it as directed on theprescription label. Do not use it more often than directed. This medication comes with INSTRUCTIONS FOR USE. Ask your pharmacist for directions on how to use this medication. Read the information carefully. Talkto your pharmacist or care team if you have questions. Talk to your care team about the use of this medication in children. While itmay be given to children for selected conditions, precautions do apply. Overdosage: If you think you have taken too much of this medicine contact apoison control center or emergency room at once. NOTE: This medicine is only for you. Do not share this medicine with others. What if I miss a dose? If you take this medication on a regular basis, take it as soon as you can. If it is almost time for your next dose, take only that dose.  Do not take doubleor extra doses. What may interact with this medication? Caffeine Chloroquine Cisapride Diuretics Medications for colds Medications for depression or for emotional or psychotic conditions Medications for weight loss including some herbal products Methadone Pentamidine Some antibiotics like clarithromycin, erythromycin, levofloxacin, and linezolid Some heart medications Steroid hormones like dexamethasone, cortisone, hydrocortisone Theophylline Thyroid hormones This list may not describe all possible interactions. Give your health care provider a list of all the medicines, herbs, non-prescription drugs, or dietary supplements you use. Also tell them if you smoke, drink alcohol, or use illegaldrugs. Some items may interact with your medicine. What should I watch for while using this medication? Visit your care team for regular checks on your progress. Tell your care teamif your symptoms do not start to get better or if they get worse. If your symptoms get worse or if you are using this medication more thannormal, call your care team right away. Do not treat yourself for coughs, colds or allergies without asking your careteam for advice. Some nonprescription medications can affect this one. You and your care team should develop an Asthma Action Plan that is just for you. Be sure to know what to do if you are in the yellow (asthma is gettingworse) or red (medical alert) zones. Your mouth may get dry. Chewing sugarless gum or sucking hard candy and drinking plenty of water may help. Contact your health care provider if theproblem does not go away or is severe. What side effects may I notice from receiving this medication? Side effects that you should report to your care team as soon as possible: Allergic reactions-skin rash, itching, hives, swelling of the face, lips, tongue,  soon as possible: Allergic reactions-skin rash, itching, hives, swelling of the face, lips, tongue, or throat Heart rhythm changes-fast or irregular heartbeat, dizziness, feeling faint  or lightheaded, chest pain, trouble breathing Increase in blood pressure Muscle pain or cramps Wheezing or trouble breathing that is worse after use Side effects that usually do not require medical attention (report to your care team if they continue or are bothersome): Change in taste Dry mouth Headache Sore throat Tremors or shaking Trouble sleeping This list may not describe all possible side effects. Call your doctor for medical advice about side effects. You may report side effects to FDA at 1-800-FDA-1088. Where should I keep my medication? Keep out of the reach of children and pets. Store at room temperature between 20 and 25 degrees C (68 and 77 degrees F). Keep inhaler away from extreme heat and cold. Get rid of it when the dose counter reads 0 or after the expiration date, whichever is first. To get rid of medications that are no longer needed or have expired: Take the medication to a medication take-back program. Check with your pharmacy or law enforcement to find a location. If you cannot return the medication, ask your care team how to get rid of this medication safely. NOTE: This sheet is a summary. It may not cover all possible information. If you have questions about this medicine, talk to your doctor, pharmacist, or health care provider.  2022 Elsevier/Gold Standard (2020-09-20 10:51:44)  

## 2021-06-11 NOTE — Progress Notes (Signed)
BP 124/71   Pulse 78   Temp 98.1 F (36.7 C)   Wt 182 lb (82.6 kg)   SpO2 99%   BMI 28.51 kg/m    Subjective:    Patient ID: Trevor Allen, male    DOB: 1960-10-20, 60 y.o.   MRN: [id number]  HPI: Trevor Allen is a 60 y.o. male presenting on 06/11/2021 for Hypertension and Hyperlipidemia   HPI   Pt had a negative covid 19 screening questionnaire.  Chief Complaint  Patient presents with   Hypertension   Hyperlipidemia     Requests to go back to protonix due to it works better.   He was changed to omeprazole due to it was available through [facility] at no cost.  Pt Reports sob/doe.  Breathing is better than when he smoked but still gets out of breath when he is trying to work.  He quit smoking in 2020.  He is not having any CP.   He says he used to have wheezing when he smoked but he doesn't hear those sounds now.   Pt says he is scheduled for a colonoscopy soon.        Relevant past medical, surgical, family and social history reviewed and updated as indicated. Interim medical history since our last visit reviewed. Allergies and medications reviewed and updated.    Current Outpatient Medications:    atorvastatin (LIPITOR) 20 MG tablet, Take 1 tablet (20 mg total) by mouth daily., Disp: 30 tablet, Rfl: 3   lisinopril (ZESTRIL) 20 MG tablet, Take 1 tablet (20 mg total) by mouth daily., Disp: 30 tablet, Rfl: 3   omeprazole (PRILOSEC) 20 MG capsule, Take 20 mg by mouth daily., Disp: , Rfl:    omeprazole (PRILOSEC) 40 MG capsule, Take 1 capsule (40 mg total) by mouth daily. (Patient not taking: No sig reported), Disp: 30 capsule, Rfl: 1    Review of Systems  Per HPI unless specifically indicated above     Objective:    BP 124/71   Pulse 78   Temp 98.1 F (36.7 C)   Wt 182 lb (82.6 kg)   SpO2 99%   BMI 28.51 kg/m   Wt Readings from Last 3 Encounters:  06/11/21 182 lb (82.6 kg)  04/17/21 185 lb 9.6 oz (84.2 kg)  02/13/21 177 lb (80.3 kg)    Physical  Exam Vitals reviewed.  Constitutional:      General: He is not in acute distress.    Appearance: He is well-developed. He is not ill-appearing.  HENT:     Head: Normocephalic and atraumatic.  Cardiovascular:     Rate and Rhythm: Normal rate and regular rhythm.     Heart sounds: No murmur heard. Pulmonary:     Effort: Pulmonary effort is normal.     Breath sounds: Normal breath sounds. No wheezing.  Abdominal:     General: Bowel sounds are normal.     Palpations: Abdomen is soft.     Tenderness: There is no abdominal tenderness.  Musculoskeletal:     Cervical back: Neck supple.     Right lower leg: No edema.     Left lower leg: No edema.  Lymphadenopathy:     Cervical: No cervical adenopathy.  Skin:    General: Skin is warm and dry.  Neurological:     Mental Status: He is alert and oriented to person, place, and time.  Psychiatric:        Behavior: Behavior normal.    Results for orders  placed or performed during the hospital encounter of 06/09/21  Lipid panel  Result Value Ref Range   Cholesterol 137 0 - 200 mg/dL   Triglycerides 79 <710 mg/dL   HDL 39 (L) >62 mg/dL   Total CHOL/HDL Ratio 3.5 RATIO   VLDL 16 0 - 40 mg/dL   LDL Cholesterol 82 0 - 99 mg/dL  Comprehensive metabolic panel  Result Value Ref Range   Sodium 134 (L) 135 - 145 mmol/L   Potassium 4.3 3.5 - 5.1 mmol/L   Chloride 104 98 - 111 mmol/L   CO2 24 22 - 32 mmol/L   Glucose, Bld 114 (H) 70 - 99 mg/dL   BUN 16 6 - 20 mg/dL   Creatinine, Ser 6.94 0.61 - 1.24 mg/dL   Calcium 9.0 8.9 - 85.4 mg/dL   Total Protein 7.7 6.5 - 8.1 g/dL   Albumin 3.9 3.5 - 5.0 g/dL   AST 27 15 - 41 U/L   ALT 42 0 - 44 U/L   Alkaline Phosphatase 76 38 - 126 U/L   Total Bilirubin 0.4 0.3 - 1.2 mg/dL   GFR, Estimated >62 >70 mL/min   Anion gap 6 5 - 15      Assessment & Plan:     Encounter Diagnoses  Name Primary?   Primary hypertension Yes   Hyperlipidemia, unspecified hyperlipidemia type    Gastroesophageal  reflux disease, unspecified whether esophagitis present       -reviewed labs with pt -pt to continue current medications for HTN and dyslipidemia -pt likely has some copd from years of smoking.  Will rx albuterol to be used prn.  It this doesn't provide him with relief, will look into testing with cxr and/or other imaging.  Pt is in agreement with this plan -pt is given rx protonix -pt to follow up here 3 months.  He is to contact office sooner if needed for worsening or new symptoms

## 2021-06-18 ENCOUNTER — Other Ambulatory Visit: Payer: Self-pay

## 2021-06-18 ENCOUNTER — Ambulatory Visit (INDEPENDENT_AMBULATORY_CARE_PROVIDER_SITE_OTHER): Payer: Self-pay | Admitting: Internal Medicine

## 2021-06-18 ENCOUNTER — Encounter: Payer: Self-pay | Admitting: Internal Medicine

## 2021-06-18 ENCOUNTER — Ambulatory Visit: Payer: Self-pay | Admitting: Internal Medicine

## 2021-06-18 ENCOUNTER — Telehealth: Payer: Self-pay

## 2021-06-18 DIAGNOSIS — K219 Gastro-esophageal reflux disease without esophagitis: Secondary | ICD-10-CM | POA: Insufficient documentation

## 2021-06-18 DIAGNOSIS — K6289 Other specified diseases of anus and rectum: Secondary | ICD-10-CM | POA: Insufficient documentation

## 2021-06-18 DIAGNOSIS — K625 Hemorrhage of anus and rectum: Secondary | ICD-10-CM | POA: Insufficient documentation

## 2021-06-18 MED ORDER — OMEPRAZOLE 40 MG PO CPDR
40.0000 mg | DELAYED_RELEASE_CAPSULE | Freq: Every day | ORAL | 11 refills | Status: DC
Start: 2021-06-18 — End: 2021-09-22

## 2021-06-18 MED ORDER — HYDROCORTISONE (PERIANAL) 2.5 % EX CREA
1.0000 | TOPICAL_CREAM | Freq: Two times a day (BID) | CUTANEOUS | 1 refills | Status: DC
Start: 2021-06-18 — End: 2021-12-23

## 2021-06-18 NOTE — Progress Notes (Signed)
Primary Care Physician:  Jacquelin Hawking, PA-C Primary Gastroenterologist:  Dr. Marletta Lor  Chief Complaint  Patient presents with   Rectal Bleeding    C/o raw spot on rectum that burns/bleeds    HPI:   Trevor Allen is a 60 y.o. male who presents to clinic today for follow-up visit.  He has a history of chronic reflux this was well controlled on omeprazole although patient has had issues with affording this medication.  He was able to for the $15 a month at [facility] but he states they recently increased to $68 so he went back to taking over-the-counter dosage.  He states this does not help as well.  No epigastric pain, dysphagia, chest pain, shortness of breath.  Does have a history of hemorrhoids, rectal bleeding, rectal discomfort.  No previous colonoscopy.  We are planning on colonoscopy on previous visit but were awaiting cone assistance.  This has reportedly been approved and he is ready to schedule today.  Past Medical History:  Diagnosis Date   GERD (gastroesophageal reflux disease)     No past surgical history on file.  Current Outpatient Medications  Medication Sig Dispense Refill   albuterol (VENTOLIN HFA) 108 (90 Base) MCG/ACT inhaler Inhale 2 puffs into the lungs every 6 (six) hours as needed for wheezing or shortness of breath. 1 each 2   atorvastatin (LIPITOR) 20 MG tablet Take 1 tablet (20 mg total) by mouth daily. 30 tablet 4   hydrocortisone (ANUSOL-HC) 2.5 % rectal cream Place 1 application rectally 2 (two) times daily. 30 g 1   lisinopril (ZESTRIL) 20 MG tablet Take 1 tablet (20 mg total) by mouth daily. 30 tablet 4   omeprazole (PRILOSEC) 40 MG capsule Take 1 capsule (40 mg total) by mouth daily. 30 capsule 11   No current facility-administered medications for this visit.    Allergies as of 06/18/2021   (No Known Allergies)    Family History  Problem Relation Age of Onset   Aneurysm Mother    Heart disease Father    Stroke Brother     Social History    Socioeconomic History   Marital status: Divorced    Spouse name: Not on file   Number of children: Not on file   Years of education: Not on file   Highest education level: Not on file  Occupational History   Not on file  Tobacco Use   Smoking status: Former    Types: Cigarettes    Quit date: 2020    Years since quitting: 2.6   Smokeless tobacco: Current    Types: Snuff  Vaping Use   Vaping Use: Never used  Substance and Sexual Activity   Alcohol use: Not Currently    Comment: alcoholic- none since 2020   Drug use: Not Currently    Comment: none since 2012   Sexual activity: Not on file  Other Topics Concern   Not on file  Social History Narrative   Not on file   Social Determinants of Health   Financial Resource Strain: Not on file  Food Insecurity: Not on file  Transportation Needs: Not on file  Physical Activity: Not on file  Stress: Not on file  Social Connections: Not on file  Intimate Partner Violence: Not on file    Subjective: Review of Systems  Constitutional:  Negative for chills and fever.  HENT:  Negative for congestion and hearing loss.   Eyes:  Negative for blurred vision and double vision.  Respiratory:  Negative for cough and shortness of breath.   Cardiovascular:  Negative for chest pain and palpitations.  Gastrointestinal:  Positive for blood in stool and heartburn. Negative for abdominal pain, constipation, diarrhea, melena and vomiting.       Rectal discomfort  Genitourinary:  Negative for dysuria and urgency.  Musculoskeletal:  Negative for joint pain and myalgias.  Skin:  Negative for itching and rash.  Neurological:  Negative for dizziness and headaches.  Psychiatric/Behavioral:  Negative for depression. The patient is not nervous/anxious.       Objective: BP 116/80   Pulse 84   Temp (!) 97.2 F (36.2 C)   Ht 5\' 7"  (1.702 m)   Wt 182 lb 12.8 oz (82.9 kg)   BMI 28.63 kg/m  Physical Exam Constitutional:      Appearance: Normal  appearance.  HENT:     Head: Normocephalic and atraumatic.  Eyes:     Extraocular Movements: Extraocular movements intact.     Conjunctiva/sclera: Conjunctivae normal.  Cardiovascular:     Rate and Rhythm: Normal rate and regular rhythm.  Pulmonary:     Effort: Pulmonary effort is normal.     Breath sounds: Normal breath sounds.  Abdominal:     General: Bowel sounds are normal.     Palpations: Abdomen is soft.  Musculoskeletal:        General: Normal range of motion.     Cervical back: Normal range of motion and neck supple.  Skin:    General: Skin is warm.  Neurological:     General: No focal deficit present.     Mental Status: He is alert and oriented to person, place, and time.  Psychiatric:        Mood and Affect: Mood normal.        Behavior: Behavior normal.     Assessment: *Heartburn-not well controlled on omeprazole 20 mg daily *Rectal bleeding *Rectal pain  Plan: Patient's heartburn not well controlled on omeprazole 20 mg daily.  He states he cannot afford omeprazole 40 mg daily as [facility] just increased this process to $60 a month..  I will send in prescription to [facility] which apparently is long and it cost him $16 per [facility].  I have printed off a coupon for him today.  In regards to his rectal bleeding and pain, we will schedule him for colonoscopy to further evaluate. The risks including infection, bleed, or perforation as well as benefits, limitations, alternatives and imponderables have been reviewed with the patient. Questions have been answered. All parties agreeable.  I will send in Anusol cream to use for symptomatic relief.  Good Rx coupon printed off for this as well.  We will perform full rectal exam under sedation.  Patient deferred rectal exam today.  06/18/2021 11:58 AM   Disclaimer: This note was dictated with voice recognition software. Similar sounding words can inadvertently be transcribed and may not be corrected upon review.

## 2021-06-18 NOTE — Telephone Encounter (Signed)
Called and informed pt of pre-op appt 06/25/21 at 11:30am. Letter mailed. Instructions and Clenpiq sample were given to pt at office visit today.

## 2021-06-18 NOTE — Patient Instructions (Signed)
We will schedule you for colonoscopy to evaluate your rectal pain as well as for colon cancer screening purposes.  For your chronic reflux, I will send in a new prescription for omeprazole 40 mg daily.  I sent this to [facility] will give you a coupon.  I will also send you in a prescription for Anusol cream to use for your rectal pain.  Further recommendations to follow.  It was nice seeing you again today.  Dr. Marletta Lor  At [facility] we value your feedback. You may receive a survey about your visit today. Please share your experience as we strive to create trusting relationships with our patients to provide genuine, compassionate, quality care.  We appreciate your understanding and patience as we review any laboratory studies, imaging, and other diagnostic tests that are ordered as we care for you. Our office policy is 5 business days for review of these results, and any emergent or urgent results are addressed in a timely manner for your best interest. If you do not hear from our office in 1 week, please contact us.   We also encourage the use of MyChart, which contains your medical information for your review as well. If you are not enrolled in this feature, an access code is on this after visit summary for your convenience. Thank you for allowing [place] to be involved in your care.  It was great to see you today!  I hope you have a great rest of your summer!!    Hennie Duos. Marletta Lor, D.O. Gastroenterology and Hepatology [facility]

## 2021-06-20 NOTE — Patient Instructions (Signed)
Trevor Allen  06/20/2021     @PREFPERIOPPHARMACY @   Your procedure is scheduled on  06/27/2021.   Report to 08/27/2021 at  0700 A.M.   Call this number if you have problems the morning of surgery:  [phone]   Remember:  Follow the diet and prep instructions given to you by the office.    Take these medicines the morning of surgery with A SIP OF WATER                                      prilosec.     Do not wear jewelry, make-up or nail polish.  Do not wear lotions, powders, or perfumes, or deodorant.  Do not shave 48 hours prior to surgery.  Men may shave face and neck.  Do not bring valuables to the hospital.  [facility] is not responsible for any belongings or valuables.  Contacts, dentures or bridgework may not be worn into surgery.  Leave your suitcase in the car.  After surgery it may be brought to your room.  For patients admitted to the hospital, discharge time will be determined by your treatment team.  Patients discharged the day of surgery will not be allowed to drive home and must have someone with them for 24 hours.    Special instructions:   DO NOT smoke tobacco or vape fore 24 hours before your procedure.  Please read over the following fact sheets that you were given. Anesthesia Post-op Instructions and Care and Recovery After Surgery      Colonoscopy, Adult, Care After This sheet gives you information about how to care for yourself after your procedure. Your health care provider may also give you more specific instructions. If you have problems or questions, contact your health care provider. What can I expect after the procedure? After the procedure, it is common to have: A small amount of blood in your stool for 24 hours after the procedure. Some gas. Mild cramping or bloating of your abdomen. Follow these instructions at home: Eating and drinking  Drink enough fluid to keep your urine pale yellow. Follow instructions from your  health care provider about eating or drinking restrictions. Resume your normal diet as instructed by your health care provider. Avoid heavy or fried foods that are hard to digest. Activity Rest as told by your health care provider. Avoid sitting for a long time without moving. Get up to take short walks every 1-2 hours. This is important to improve blood flow and breathing. Ask for help if you feel weak or unsteady. Return to your normal activities as told by your health care provider. Ask your health care provider what activities are safe for you. Managing cramping and bloating  Try walking around when you have cramps or feel bloated. Apply heat to your abdomen as told by your health care provider. Use the heat source that your health care provider recommends, such as a moist heat pack or a heating pad. Place a towel between your skin and the heat source. Leave the heat on for 20-30 minutes. Remove the heat if your skin turns bright red. This is especially important if you are unable to feel pain, heat, or cold. You may have a greater risk of getting burned. General instructions If you were given a sedative during the procedure, it can affect you for several hours. Do not  drive or operate machinery until your health care provider says that it is safe. For the first 24 hours after the procedure: Do not sign important documents. Do not drink alcohol. Do your regular daily activities at a slower pace than normal. Eat soft foods that are easy to digest. Take over-the-counter and prescription medicines only as told by your health care provider. Keep all follow-up visits as told by your health care provider. This is important. Contact a health care provider if: You have blood in your stool 2-3 days after the procedure. Get help right away if you have: More than a small spotting of blood in your stool. Large blood clots in your stool. Swelling of your abdomen. Nausea or vomiting. A  fever. Increasing pain in your abdomen that is not relieved with medicine. Summary After the procedure, it is common to have a small amount of blood in your stool. You may also have mild cramping and bloating of your abdomen. If you were given a sedative during the procedure, it can affect you for several hours. Do not drive or operate machinery until your health care provider says that it is safe. Get help right away if you have a lot of blood in your stool, nausea or vomiting, a fever, or increased pain in your abdomen. This information is not intended to replace advice given to you by your health care provider. Make sure you discuss any questions you have with your health care provider. Document Revised: 09/29/2019 Document Reviewed: 05/01/2019 Elsevier Patient Education  [place] After This sheet gives you information about how to care for yourself after your procedure. Your health care provider may also give you more specific instructions. If you have problems or questions, contact your health care provider. What can I expect after the procedure? After the procedure, it is common to have: Tiredness. Forgetfulness about what happened after the procedure. Impaired judgment for important decisions. Nausea or vomiting. Some difficulty with balance. Follow these instructions at home: For the time period you were told by your health care provider:   Rest as needed. Do not participate in activities where you could fall or become injured. Do not drive or use machinery. Do not drink alcohol. Do not take sleeping pills or medicines that cause drowsiness. Do not make important decisions or sign legal documents. Do not take care of children on your own. Eating and drinking Follow the diet that is recommended by your health care provider. Drink enough fluid to keep your urine pale yellow. If you vomit: Drink water, juice, or soup when you can drink  without vomiting. Make sure you have little or no nausea before eating solid foods. General instructions Have a responsible adult stay with you for the time you are told. It is important to have someone help care for you until you are awake and alert. Take over-the-counter and prescription medicines only as told by your health care provider. If you have sleep apnea, surgery and certain medicines can increase your risk for breathing problems. Follow instructions from your health care provider about wearing your sleep device: Anytime you are sleeping, including during daytime naps. While taking prescription pain medicines, sleeping medicines, or medicines that make you drowsy. Avoid smoking. Keep all follow-up visits as told by your health care provider. This is important. Contact a health care provider if: You keep feeling nauseous or you keep vomiting. You feel light-headed. You are still sleepy or having trouble with balance after 24  hours. You develop a rash. You have a fever. You have redness or swelling around the IV site. Get help right away if: You have trouble breathing. You have new-onset confusion at home. Summary For several hours after your procedure, you may feel tired. You may also be forgetful and have poor judgment. Have a responsible adult stay with you for the time you are told. It is important to have someone help care for you until you are awake and alert. Rest as told. Do not drive or operate machinery. Do not drink alcohol or take sleeping pills. Get help right away if you have trouble breathing, or if you suddenly become confused. This information is not intended to replace advice given to you by your health care provider. Make sure you discuss any questions you have with your health care provider. Document Revised: 06/20/2020 Document Reviewed: 09/07/2019 Elsevier Patient Education  2022 [employer].

## 2021-06-24 ENCOUNTER — Telehealth: Payer: Self-pay | Admitting: Internal Medicine

## 2021-06-24 NOTE — Telephone Encounter (Signed)
Patient needs to cancel his procedure. He has covid

## 2021-06-24 NOTE — Telephone Encounter (Signed)
Called pt. He wants to cancel for now and did not want to r/s at this time. He will call back when ready. Message sent to endo.

## 2021-06-25 ENCOUNTER — Encounter (HOSPITAL_COMMUNITY): Payer: Self-pay

## 2021-06-25 ENCOUNTER — Encounter (HOSPITAL_COMMUNITY)
Admission: RE | Admit: 2021-06-25 | Discharge: 2021-06-25 | Disposition: A | Discharge status: Home / Self Care | Payer: Self-pay | Source: Ambulatory Visit | Attending: Internal Medicine | Admitting: Internal Medicine

## 2021-06-27 ENCOUNTER — Ambulatory Visit (HOSPITAL_COMMUNITY): Admission: RE | Admit: 2021-06-27 | Payer: Self-pay | Source: Home / Self Care

## 2021-06-27 ENCOUNTER — Encounter (HOSPITAL_COMMUNITY): Admission: RE | Payer: Self-pay | Source: Home / Self Care

## 2021-06-27 SURGERY — COLONOSCOPY WITH PROPOFOL
Anesthesia: Monitor Anesthesia Care

## 2021-08-27 ENCOUNTER — Other Ambulatory Visit: Payer: Self-pay | Admitting: Physician Assistant

## 2021-08-27 DIAGNOSIS — E785 Hyperlipidemia, unspecified: Secondary | ICD-10-CM

## 2021-08-27 DIAGNOSIS — I10 Essential (primary) hypertension: Secondary | ICD-10-CM

## 2021-09-09 ENCOUNTER — Telehealth: Payer: Self-pay

## 2021-09-09 NOTE — Telephone Encounter (Signed)
Call from pt to confirm next appt, informed 09/16/21 at 8am & informed it is an EV.

## 2021-09-16 ENCOUNTER — Ambulatory Visit: Payer: Self-pay | Admitting: Physician Assistant

## 2021-09-16 DIAGNOSIS — I10 Essential (primary) hypertension: Secondary | ICD-10-CM

## 2021-09-16 DIAGNOSIS — K219 Gastro-esophageal reflux disease without esophagitis: Secondary | ICD-10-CM

## 2021-09-16 DIAGNOSIS — E785 Hyperlipidemia, unspecified: Secondary | ICD-10-CM

## 2021-09-16 NOTE — Progress Notes (Signed)
   There were no vitals taken for this visit.   Subjective:    Patient ID: Trevor Allen, male    DOB: 11-May-1961, 60 y.o.   MRN: [id number]  HPI: Trevor Allen is a 60 y.o. male presenting on 09/16/2021 for No chief complaint on file.   HPI   This is a telemedicine appointment today through [employer].  Pt was having difficulty with his device and could not get his camera to work.  I connected with  Kathlen Mody on 09/16/21 by a video enabled telemedicine application and verified that I am speaking with the correct person using two identifiers.   I discussed the limitations of evaluation and management by telemedicine. The patient expressed understanding and agreed to proceed.  Pt is at his job.   Provider is at office.   Pt is 60yoM with Appointment today to follow up htn, dyslipidemia, GERD and copd.   He doesn't have his medications with him and doesn't know what he is taking. He didn't get his labs drawn.    Relevant past medical, surgical, family and social history reviewed and updated as indicated. Interim medical history since our last visit reviewed. Allergies and medications reviewed and updated.  Review of Systems  Per HPI unless specifically indicated above     Objective:    There were no vitals taken for this visit.  Wt Readings from Last 3 Encounters:  06/18/21 182 lb 12.8 oz (82.9 kg)  06/11/21 182 lb (82.6 kg)  04/17/21 185 lb 9.6 oz (84.2 kg)    Physical Exam Constitutional:      General: He is not in acute distress. Pulmonary:     Effort: No respiratory distress.     Comments: Pt is talking in complete sentences without dyspnea Neurological:     Mental Status: He is alert and oriented to person, place, and time.          Assessment & Plan:   Encounter Diagnoses  Name Primary?   Primary hypertension Yes   Hyperlipidemia, unspecified hyperlipidemia type    Gastroesophageal reflux disease, unspecified whether esophagitis present      -pt  to get fasting labs drawn -he is scheduled to come in to office next week.  He is reminded to bring all of his medications with him to his appointment

## 2021-09-17 ENCOUNTER — Other Ambulatory Visit (HOSPITAL_COMMUNITY)
Admission: RE | Admit: 2021-09-17 | Discharge: 2021-09-17 | Disposition: A | Discharge status: Home / Self Care | Payer: Self-pay | Source: Ambulatory Visit | Attending: Physician Assistant | Admitting: Physician Assistant

## 2021-09-17 DIAGNOSIS — I10 Essential (primary) hypertension: Secondary | ICD-10-CM | POA: Insufficient documentation

## 2021-09-17 DIAGNOSIS — E785 Hyperlipidemia, unspecified: Secondary | ICD-10-CM | POA: Insufficient documentation

## 2021-09-17 LAB — COMPREHENSIVE METABOLIC PANEL
ALT: 26 U/L (ref 0–44)
AST: 20 U/L (ref 15–41)
Albumin: 3.5 g/dL (ref 3.5–5.0)
Alkaline Phosphatase: 70 U/L (ref 38–126)
Anion gap: 5 (ref 5–15)
BUN: 13 mg/dL (ref 6–20)
CO2: 26 mmol/L (ref 22–32)
Calcium: 8.9 mg/dL (ref 8.9–10.3)
Chloride: 105 mmol/L (ref 98–111)
Creatinine, Ser: 0.87 mg/dL (ref 0.61–1.24)
GFR, Estimated: >60 mL/min (ref >60)
Glucose, Bld: 105 mg/dL — ABNORMAL HIGH (ref 70–99)
Potassium: 4.2 mmol/L (ref 3.5–5.1)
Sodium: 136 mmol/L (ref 135–145)
Total Bilirubin: 0.3 mg/dL (ref 0.3–1.2)
Total Protein: 7.1 g/dL (ref 6.5–8.1)

## 2021-09-17 LAB — LIPID PANEL
Cholesterol: 135 mg/dL (ref 0–200)
HDL: 39 mg/dL — ABNORMAL LOW (ref >40)
LDL Cholesterol: 82 mg/dL (ref 0–99)
Total CHOL/HDL Ratio: 3.5 RATIO
Triglycerides: 70 mg/dL (ref <150)
VLDL: 14 mg/dL (ref 0–40)

## 2021-09-22 ENCOUNTER — Encounter: Payer: Self-pay | Admitting: Internal Medicine

## 2021-09-22 ENCOUNTER — Telehealth: Payer: Self-pay | Admitting: Internal Medicine

## 2021-09-22 ENCOUNTER — Encounter: Payer: Self-pay | Admitting: Physician Assistant

## 2021-09-22 ENCOUNTER — Ambulatory Visit: Payer: Self-pay | Admitting: Physician Assistant

## 2021-09-22 VITALS — BP 119/81 | HR 94 | Temp 98.0°F | Wt 185.0 lb

## 2021-09-22 DIAGNOSIS — E785 Hyperlipidemia, unspecified: Secondary | ICD-10-CM

## 2021-09-22 DIAGNOSIS — K219 Gastro-esophageal reflux disease without esophagitis: Secondary | ICD-10-CM

## 2021-09-22 DIAGNOSIS — I10 Essential (primary) hypertension: Secondary | ICD-10-CM

## 2021-09-22 MED ORDER — ALBUTEROL SULFATE HFA 108 (90 BASE) MCG/ACT IN AERS: 2.0000 | INHALATION_SPRAY | Freq: Four times a day (QID) | RESPIRATORY_TRACT | 0 refills | Status: AC | PRN

## 2021-09-22 MED ORDER — LISINOPRIL 20 MG PO TABS: 20.0000 mg | ORAL_TABLET | Freq: Every day | ORAL | 1 refills | Status: DC

## 2021-09-22 MED ORDER — OMEPRAZOLE 40 MG PO CPDR: 40.0000 mg | DELAYED_RELEASE_CAPSULE | Freq: Every day | ORAL | 3 refills | Status: DC

## 2021-09-22 MED ORDER — ATORVASTATIN CALCIUM 20 MG PO TABS: 20.0000 mg | ORAL_TABLET | Freq: Every day | ORAL | 1 refills | Status: DC

## 2021-09-22 NOTE — Telephone Encounter (Signed)
Please schedule OV, last OV was 05/2021.

## 2021-09-22 NOTE — Telephone Encounter (Signed)
WANTS TO SCHEDULE HIS PROCEDURE NOW, DOES HE NEED ANOTHER OFFICE VISIT?

## 2021-09-22 NOTE — Progress Notes (Signed)
BP 119/81   Pulse 94   Temp 98 F (36.7 C)   Wt 185 lb (83.9 kg)   SpO2 97%   BMI 28.98 kg/m    Subjective:    Patient ID: Trevor Allen, male    DOB: 1961/08/21, 60 y.o.   MRN: [id number]  HPI: Trevor Allen is a 60 y.o. male presenting on 09/22/2021 for Hypertension, Hyperlipidemia, COPD, and Gastroesophageal Reflux   HPI  Chief Complaint  Patient presents with   Hypertension   Hyperlipidemia   COPD   Gastroesophageal Reflux    Pt says he is fine and has no complaints today.  He is using OTC acid reducer because Rx omeprazole went up at [facility].   He was scheduled for testing with GI but that was cancelled because he hadn't gotten his cone financial assistance at that time.  He says he doesn't know if he ever got approved.Marland Kitchen   He is not having CP.  He says the albuterol helps his sob when he gets sob which is only occasionally.    Relevant past medical, surgical, family and social history reviewed and updated as indicated. Interim medical history since our last visit reviewed. Allergies and medications reviewed and updated.   Current Outpatient Medications:    atorvastatin (LIPITOR) 20 MG tablet, Take 1 tablet (20 mg total) by mouth daily., Disp: 30 tablet, Rfl: 4   Cyanocobalamin (B-12 PO), Take 1 tablet by mouth daily., Disp: , Rfl:    hydrocortisone (ANUSOL-HC) 2.5 % rectal cream, Place 1 application rectally 2 (two) times daily., Disp: 30 g, Rfl: 1   lisinopril (ZESTRIL) 20 MG tablet, Take 1 tablet (20 mg total) by mouth daily., Disp: 30 tablet, Rfl: 4   raNITIdine HCl (ACID REDUCER PO), Take by mouth., Disp: , Rfl:    albuterol (VENTOLIN HFA) 108 (90 Base) MCG/ACT inhaler, Inhale 2 puffs into the lungs every 6 (six) hours as needed for wheezing or shortness of breath. (Patient not taking: Reported on 06/18/2021), Disp: 1 each, Rfl: 2   Multiple Vitamin (MULTIVITAMIN WITH MINERALS) TABS tablet, Take 1 tablet by mouth daily. (Patient not taking: Reported on  09/22/2021), Disp: , Rfl:    omeprazole (PRILOSEC) 40 MG capsule, Take 1 capsule (40 mg total) by mouth daily. (Patient not taking: Reported on 09/22/2021), Disp: 30 capsule, Rfl: 11    Review of Systems  Per HPI unless specifically indicated above     Objective:    BP 119/81   Pulse 94   Temp 98 F (36.7 C)   Wt 185 lb (83.9 kg)   SpO2 97%   BMI 28.98 kg/m   Wt Readings from Last 3 Encounters:  09/22/21 185 lb (83.9 kg)  06/18/21 182 lb 12.8 oz (82.9 kg)  06/11/21 182 lb (82.6 kg)    Physical Exam Vitals reviewed.  Constitutional:      General: He is not in acute distress.    Appearance: He is well-developed. He is not ill-appearing.  HENT:     Head: Normocephalic and atraumatic.  Cardiovascular:     Rate and Rhythm: Normal rate and regular rhythm.  Pulmonary:     Effort: Pulmonary effort is normal.     Breath sounds: Normal breath sounds. No wheezing.  Abdominal:     General: Bowel sounds are normal.     Palpations: Abdomen is soft.     Tenderness: There is no abdominal tenderness.  Musculoskeletal:     Cervical back: Neck supple.     Right  lower leg: No edema.     Left lower leg: No edema.  Lymphadenopathy:     Cervical: No cervical adenopathy.  Skin:    General: Skin is warm and dry.  Neurological:     Mental Status: He is alert and oriented to person, place, and time.  Psychiatric:        Behavior: Behavior normal.    Results for orders placed or performed during the hospital encounter of 09/17/21  Lipid panel  Result Value Ref Range   Cholesterol 135 0 - 200 mg/dL   Triglycerides 70 <188 mg/dL   HDL 39 (L) >41 mg/dL   Total CHOL/HDL Ratio 3.5 RATIO   VLDL 14 0 - 40 mg/dL   LDL Cholesterol 82 0 - 99 mg/dL  Comprehensive metabolic panel  Result Value Ref Range   Sodium 136 135 - 145 mmol/L   Potassium 4.2 3.5 - 5.1 mmol/L   Chloride 105 98 - 111 mmol/L   CO2 26 22 - 32 mmol/L   Glucose, Bld 105 (H) 70 - 99 mg/dL   BUN 13 6 - 20 mg/dL    Creatinine, Ser 6.60 0.61 - 1.24 mg/dL   Calcium 8.9 8.9 - 63.0 mg/dL   Total Protein 7.1 6.5 - 8.1 g/dL   Albumin 3.5 3.5 - 5.0 g/dL   AST 20 15 - 41 U/L   ALT 26 0 - 44 U/L   Alkaline Phosphatase 70 38 - 126 U/L   Total Bilirubin 0.3 0.3 - 1.2 mg/dL   GFR, Estimated >16 >01 mL/min   Anion gap 5 5 - 15      Assessment & Plan:    Encounter Diagnoses  Name Primary?   Primary hypertension Yes   Hyperlipidemia, unspecified hyperlipidemia type    Gastroesophageal reflux disease, unspecified whether esophagitis present       -reviewed lab with pt -no medication changes today -rx sent to [facility] -educated and encouraged pt to get Covid vaccination -Pt doesn't know if he got cafa.  [facility] will check for him -pt to follow up 3 months.  He is to contact office sooner prn   -Pt was called after his appointment and notified he has active cafa.  He should contact Gi to get scheduled

## 2021-10-07 ENCOUNTER — Other Ambulatory Visit: Payer: Self-pay | Admitting: Physician Assistant

## 2021-10-07 MED ORDER — LISINOPRIL 20 MG PO TABS: 20.0000 mg | ORAL_TABLET | Freq: Every day | ORAL | 3 refills | Status: DC

## 2021-10-07 MED ORDER — ATORVASTATIN CALCIUM 20 MG PO TABS: 20.0000 mg | ORAL_TABLET | Freq: Every day | ORAL | 3 refills | Status: AC

## 2021-12-08 ENCOUNTER — Other Ambulatory Visit: Payer: Self-pay | Admitting: Physician Assistant

## 2021-12-08 DIAGNOSIS — I10 Essential (primary) hypertension: Secondary | ICD-10-CM

## 2021-12-08 DIAGNOSIS — E785 Hyperlipidemia, unspecified: Secondary | ICD-10-CM

## 2021-12-23 ENCOUNTER — Other Ambulatory Visit (HOSPITAL_COMMUNITY)
Admission: RE | Admit: 2021-12-23 | Discharge: 2021-12-23 | Disposition: A | Discharge status: Home / Self Care | Payer: Self-pay | Source: Ambulatory Visit | Attending: Physician Assistant | Admitting: Physician Assistant

## 2021-12-23 ENCOUNTER — Other Ambulatory Visit: Payer: Self-pay

## 2021-12-23 ENCOUNTER — Encounter: Payer: Self-pay | Admitting: Physician Assistant

## 2021-12-23 ENCOUNTER — Ambulatory Visit (HOSPITAL_COMMUNITY)
Admission: RE | Admit: 2021-12-23 | Discharge: 2021-12-23 | Disposition: A | Discharge status: Home / Self Care | Payer: Self-pay | Source: Ambulatory Visit | Attending: Physician Assistant | Admitting: Physician Assistant

## 2021-12-23 ENCOUNTER — Ambulatory Visit: Payer: Self-pay | Admitting: Physician Assistant

## 2021-12-23 VITALS — BP 113/81 | HR 94 | Temp 98.6°F | Wt 183.5 lb

## 2021-12-23 DIAGNOSIS — R079 Chest pain, unspecified: Secondary | ICD-10-CM

## 2021-12-23 DIAGNOSIS — E785 Hyperlipidemia, unspecified: Secondary | ICD-10-CM

## 2021-12-23 DIAGNOSIS — I10 Essential (primary) hypertension: Secondary | ICD-10-CM

## 2021-12-23 DIAGNOSIS — K219 Gastro-esophageal reflux disease without esophagitis: Secondary | ICD-10-CM

## 2021-12-23 LAB — COMPREHENSIVE METABOLIC PANEL
ALT: 43 U/L (ref 0–44)
AST: 24 U/L (ref 15–41)
Albumin: 3.9 g/dL (ref 3.5–5.0)
Alkaline Phosphatase: 75 U/L (ref 38–126)
Anion gap: 8 (ref 5–15)
BUN: 15 mg/dL (ref 6–20)
CO2: 24 mmol/L (ref 22–32)
Calcium: 9 mg/dL (ref 8.9–10.3)
Chloride: 105 mmol/L (ref 98–111)
Creatinine, Ser: 0.83 mg/dL (ref 0.61–1.24)
GFR, Estimated: >60 mL/min (ref >60)
Glucose, Bld: 111 mg/dL — ABNORMAL HIGH (ref 70–99)
Potassium: 4.1 mmol/L (ref 3.5–5.1)
Sodium: 137 mmol/L (ref 135–145)
Total Bilirubin: 0.4 mg/dL (ref 0.3–1.2)
Total Protein: 7.8 g/dL (ref 6.5–8.1)

## 2021-12-23 LAB — LIPID PANEL
Cholesterol: 149 mg/dL (ref 0–200)
HDL: 44 mg/dL (ref >40)
LDL Cholesterol: 92 mg/dL (ref 0–99)
Total CHOL/HDL Ratio: 3.4 RATIO
Triglycerides: 64 mg/dL (ref <150)
VLDL: 13 mg/dL (ref 0–40)

## 2021-12-23 MED ORDER — NITROGLYCERIN 0.4 MG SL SUBL: 0.4000 mg | SUBLINGUAL_TABLET | SUBLINGUAL | 3 refills | Status: AC | PRN

## 2021-12-23 MED ORDER — ASPIRIN 81 MG PO TBEC: 81.0000 mg | DELAYED_RELEASE_TABLET | Freq: Every day | ORAL | 12 refills | Status: AC

## 2021-12-23 MED ORDER — METOPROLOL TARTRATE 100 MG PO TABS: 100.0000 mg | ORAL_TABLET | Freq: Two times a day (BID) | ORAL | 3 refills | Status: AC

## 2021-12-23 MED ORDER — OMEPRAZOLE MAGNESIUM 20 MG PO TBEC: 20.0000 mg | DELAYED_RELEASE_TABLET | Freq: Every day | ORAL | 99 refills | Status: AC

## 2021-12-23 NOTE — Progress Notes (Signed)
? ?BP 113/81   Pulse 94   Temp 98.6 ?F (37 ?C)   Wt 183 lb 8 oz (83.2 kg)   SpO2 97%   BMI 28.74 kg/m?   ? ?Subjective:  ? ? Patient ID: Trevor Allen, male    DOB: 1961/07/26, 61 y.o.   MRN: [id number] ? ?HPI: ?Trevor Allen is a 61 y.o. male presenting on 12/23/2021 for Hyperlipidemia and Hypertension ? ? ?HPI ? ?Chief Complaint  ?Patient presents with  ? Hyperlipidemia  ? Hypertension  ? ? ?Pt says he has been having chest pains.  His chest feels tight.  This has been going about 6 months.  He says it is happening more frequently now.  One day this past week he had pain that radiated into his left shoulder.   Sometimes it lasts 5 minutes, sometimes 10 or 15 minutes.  He says that Only one time it lasted for about an hour.   ? ?He quit smoking in 2020.   ? ?He sometimes gets sob with the CP. ? ?His father died at 50 with MI.  4 aunts died with MI.  His brother died at 45 with CVA.   ? ? ?He is not taking his rx omeprazole but is taking OTC bid.  He says he changed because [facility] no longer honored their $15 cost for this medication.   ? ?He did not get his labs done.  He wasn't aware he needed to.  ? ?He has never had an EKG or other heart test.  ? ?He is not got covid vaccination.  ? 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 ? ? ?Relevant past medical, surgical, family and social history reviewed and updated as indicated. Interim medical history since our last visit reviewed. ?Allergies and medications reviewed and updated. ? ? ? ?Current Outpatient Medications:  ?  atorvastatin (LIPITOR) 20 MG tablet, Take 1 tablet (20 mg total) by mouth daily., Disp: 30 tablet, Rfl: 3 ?  Cyanocobalamin (B-12 PO), Take 1 tablet by mouth daily., Disp: , Rfl:  ?  lisinopril (ZESTRIL) 20 MG tablet, Take 1 tablet (20 mg total) by mouth daily., Disp: 30 tablet, Rfl: 3 ?  Multiple Vitamin (MULTIVITAMIN WITH MINERALS) TABS tablet, Take 1 tablet by mouth daily., Disp: , Rfl:  ?  omeprazole (PRILOSEC OTC) 20 MG tablet, Take 20 mg  by mouth in the morning and at bedtime., Disp: , Rfl:  ?  albuterol (VENTOLIN HFA) 108 (90 Base) MCG/ACT inhaler, Inhale 2 puffs into the lungs every 6 (six) hours as needed for wheezing or shortness of breath. (Patient not taking: Reported on 12/23/2021), Disp: 3 each, Rfl: 0 ?  hydrocortisone (ANUSOL-HC) 2.5 % rectal cream, Place 1 application rectally 2 (two) times daily. (Patient not taking: Reported on 12/23/2021), Disp: 30 g, Rfl: 1 ?  omeprazole (PRILOSEC) 40 MG capsule, Take 1 capsule (40 mg total) by mouth daily. (Patient not taking: Reported on 12/23/2021), Disp: 90 capsule, Rfl: 3 ?  raNITIdine HCl (ACID REDUCER PO), Take by mouth. (Patient not taking: Reported on 12/23/2021), Disp: , Rfl:  ? ? ?Review of Systems ? ?Per HPI unless specifically indicated above ? ?   ?Objective:  ?  ?BP 113/81   Pulse 94   Temp 98.6 ?F (37 ?C)   Wt 183 lb 8 oz (83.2 kg)   SpO2 97%   BMI 28.74 kg/m?   ?Wt Readings from Last 3 Encounters:  ?12/23/21 183 lb 8 oz (83.2 kg)  ?09/22/21 185 lb (83.9 kg)  ?  06/18/21 182 lb 12.8 oz (82.9 kg)  ?  ?Physical Exam ?Vitals reviewed.  ?Constitutional:   ?   General: He is not in acute distress. ?   Appearance: He is well-developed. He is not ill-appearing.  ?HENT:  ?   Head: Normocephalic and atraumatic.  ?Cardiovascular:  ?   Rate and Rhythm: Normal rate and regular rhythm.  ?Pulmonary:  ?   Effort: Pulmonary effort is normal.  ?   Breath sounds: Normal breath sounds. No wheezing.  ?Abdominal:  ?   General: Bowel sounds are normal.  ?   Palpations: Abdomen is soft.  ?   Tenderness: There is no abdominal tenderness.  ?Musculoskeletal:  ?   Cervical back: Neck supple.  ?   Right lower leg: No edema.  ?   Left lower leg: No edema.  ?Lymphadenopathy:  ?   Cervical: No cervical adenopathy.  ?Skin: ?   General: Skin is warm and dry.  ?Neurological:  ?   Mental Status: He is alert and oriented to person, place, and time.  ?Psychiatric:     ?   Behavior: Behavior normal.  ? ? ? ? ?EKG NSR at 84bpm   no st-t changes.  No previous for comparison ? ?   ?Assessment & Plan:  ? ? ?Encounter Diagnoses  ?Name Primary?  ? Primary hypertension Yes  ? Hyperlipidemia, unspecified hyperlipidemia type   ? Chest pain, unspecified type   ? Gastroesophageal reflux disease, unspecified whether esophagitis present   ? ? ? ?-pt to get fasting labs drawn ?-pt educated and encouraged to get covid vaccination ?-pt is not currently active with [facility].  He was given application.    Will be able to sent his rx at no cost when he gets enrolled ?-rx sent to [facility] until he gets enrolled with [facility] ?-start metoprolol and NTG and asa due to CP ?-will discontinue the lisinopril in light of starting the metoprolol ?-Refer to cardiology for evaluation of CP.  cardiac risk factors:  htn, dyslipidemia, family history, long history smoking (although he quit in 2020) ?-ordered CXR ?-pt was given [facility]/application for [employer] financial assistance ?-Pt to monitor bp.  He is encouraged to contact office for systolic BP over 130 or under 865 ?-pt to continue with bid OTC omeprazole.  Will get him on 40mg  omeprazole when he gets approved for [facility] ?-pt to follow up here 1 month.  He is to contact office sooner prn ? ? ?

## 2021-12-24 ENCOUNTER — Other Ambulatory Visit: Payer: Self-pay | Admitting: Physician Assistant

## 2021-12-24 MED ORDER — AZITHROMYCIN 250 MG PO TABS: ORAL_TABLET | ORAL | 0 refills | Status: AC

## 2022-01-26 ENCOUNTER — Ambulatory Visit: Payer: Self-pay | Admitting: Physician Assistant

## 2022-01-27 ENCOUNTER — Ambulatory Visit: Payer: Self-pay | Admitting: Gastroenterology

## 2022-01-27 ENCOUNTER — Ambulatory Visit: Payer: Self-pay | Admitting: Physician Assistant

## 2022-01-27 ENCOUNTER — Encounter: Payer: Self-pay | Admitting: Physician Assistant

## 2022-01-27 VITALS — BP 132/80 | HR 64 | Temp 97.3°F | Wt 186.0 lb

## 2022-01-27 DIAGNOSIS — I10 Essential (primary) hypertension: Secondary | ICD-10-CM

## 2022-01-27 DIAGNOSIS — E785 Hyperlipidemia, unspecified: Secondary | ICD-10-CM

## 2022-01-27 NOTE — Progress Notes (Signed)
? ?BP 132/80   Pulse 64   Temp (!) 97.3 ?F (36.3 ?C)   Wt 186 lb (84.4 kg)   SpO2 98%   BMI 29.13 kg/m?   ? ?Subjective:  ? ? Patient ID: Trevor Allen, male    DOB: Sep 02, 1961, 61 y.o.   MRN: [id number] ? ?HPI: ?Trevor Allen is a 61 y.o. male presenting on 01/27/2022 for Follow-up ? ? ?HPI ? ? ?He says he is going back to [place] next week.   He says he will not be returning to [place].  ? ?Pt was referred to cardiology last month for evaluation of chest pain.  He has not yet been scheduled.   ? ?He says says he is not having CP any longer.  He says he felt all better after the antibiotics he was given as a result of his CXR.  ? ?He did not complete his [employer] financial assistance application.  He says he is just going to pay his bill so he can leave town.  ? ? ? ? ?Relevant past medical, surgical, family and social history reviewed and updated as indicated. Interim medical history since our last visit reviewed. ?Allergies and medications reviewed and updated. ? ? ?Current Outpatient Medications:  ?  aspirin 81 MG EC tablet, Take 1 tablet (81 mg total) by mouth daily. Swallow whole., Disp: 30 tablet, Rfl: 12 ?  atorvastatin (LIPITOR) 20 MG tablet, Take 1 tablet (20 mg total) by mouth daily., Disp: 30 tablet, Rfl: 3 ?  metoprolol tartrate (LOPRESSOR) 100 MG tablet, Take 1 tablet (100 mg total) by mouth 2 (two) times daily., Disp: 60 tablet, Rfl: 3 ?  albuterol (VENTOLIN HFA) 108 (90 Base) MCG/ACT inhaler, Inhale 2 puffs into the lungs every 6 (six) hours as needed for wheezing or shortness of breath. (Patient not taking: Reported on 12/23/2021), Disp: 3 each, Rfl: 0 ?  Cyanocobalamin (B-12 PO), Take 1 tablet by mouth daily. (Patient not taking: Reported on 01/27/2022), Disp: , Rfl:  ?  Multiple Vitamin (MULTIVITAMIN WITH MINERALS) TABS tablet, Take 1 tablet by mouth daily. (Patient not taking: Reported on 01/27/2022), Disp: , Rfl:  ?  nitroGLYCERIN (NITROSTAT) 0.4 MG SL tablet, Place 1 tablet (0.4 mg  total) under the tongue every 5 (five) minutes as needed for chest pain. (Patient not taking: Reported on 01/27/2022), Disp: 25 tablet, Rfl: 3 ?  omeprazole (PRILOSEC OTC) 20 MG tablet, Take 1 tablet (20 mg total) by mouth daily. (Patient not taking: Reported on 01/27/2022), Disp: 60 tablet, Rfl: PRN ? ? ? ? ?Review of Systems ? ?Per HPI unless specifically indicated above ? ?   ?Objective:  ?  ?BP 132/80   Pulse 64   Temp (!) 97.3 ?F (36.3 ?C)   Wt 186 lb (84.4 kg)   SpO2 98%   BMI 29.13 kg/m?   ?Wt Readings from Last 3 Encounters:  ?01/27/22 186 lb (84.4 kg)  ?12/23/21 183 lb 8 oz (83.2 kg)  ?09/22/21 185 lb (83.9 kg)  ?  ?Physical Exam ?Vitals reviewed.  ?Constitutional:   ?   General: He is not in acute distress. ?   Appearance: He is well-developed. He is not toxic-appearing.  ?HENT:  ?   Head: Normocephalic and atraumatic.  ?Cardiovascular:  ?   Rate and Rhythm: Normal rate and regular rhythm.  ?Pulmonary:  ?   Effort: Pulmonary effort is normal.  ?   Breath sounds: Normal breath sounds. No wheezing.  ?Abdominal:  ?   General: Bowel sounds  are normal.  ?   Palpations: Abdomen is soft.  ?   Tenderness: There is no abdominal tenderness.  ?Musculoskeletal:  ?   Cervical back: Neck supple.  ?   Right lower leg: No edema.  ?   Left lower leg: No edema.  ?Lymphadenopathy:  ?   Cervical: No cervical adenopathy.  ?Skin: ?   General: Skin is warm and dry.  ?Neurological:  ?   Mental Status: He is alert and oriented to person, place, and time.  ?Psychiatric:     ?   Behavior: Behavior normal.  ? ? ? ? ? ?   ?Assessment & Plan:  ? ?Encounter Diagnoses  ?Name Primary?  ? Primary hypertension Yes  ? Hyperlipidemia, unspecified hyperlipidemia type   ? ? ? ?-pt is encouraged to continue metoprolol and atorvastatin.  He has refills that can be transferred to [place] with him ?-he has inhaler to use if needed. ?-pt was encouraged to get established with PCP when he gets to [place] for ongoing management of htn and  dyslipidemia.  Discussed with pt recommendations that he repeat his CXR when he gets to his new home ?-pt was reminded that he has appointment with GI this afternoon ? ?

## 2023-03-24 IMAGING — DX DG CHEST 2V
2 series · 2 of 2 positions shown · non-contrast
Comparison: None.

CLINICAL DATA: Chest pain.

EXAM:
CHEST - 2 VIEW

[chest pa]
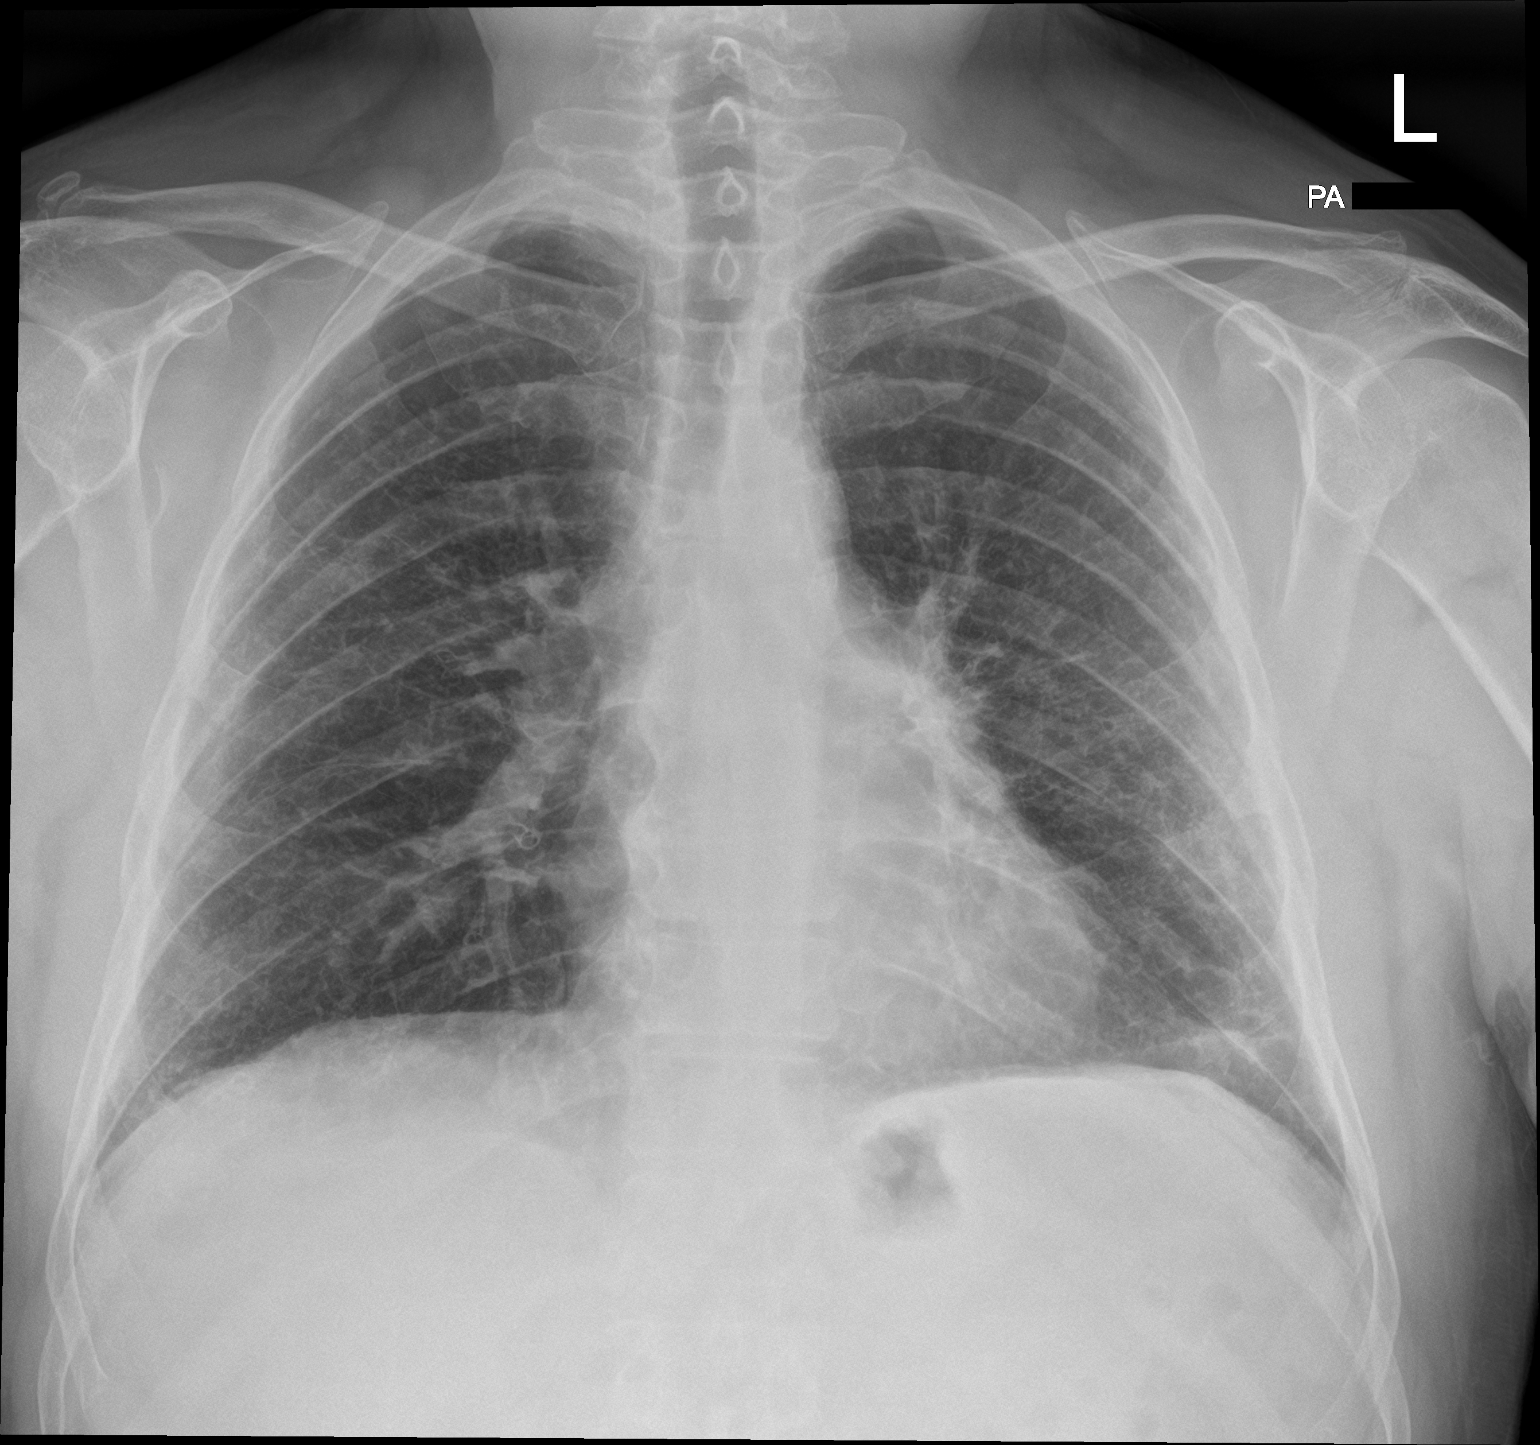

[chest lat]
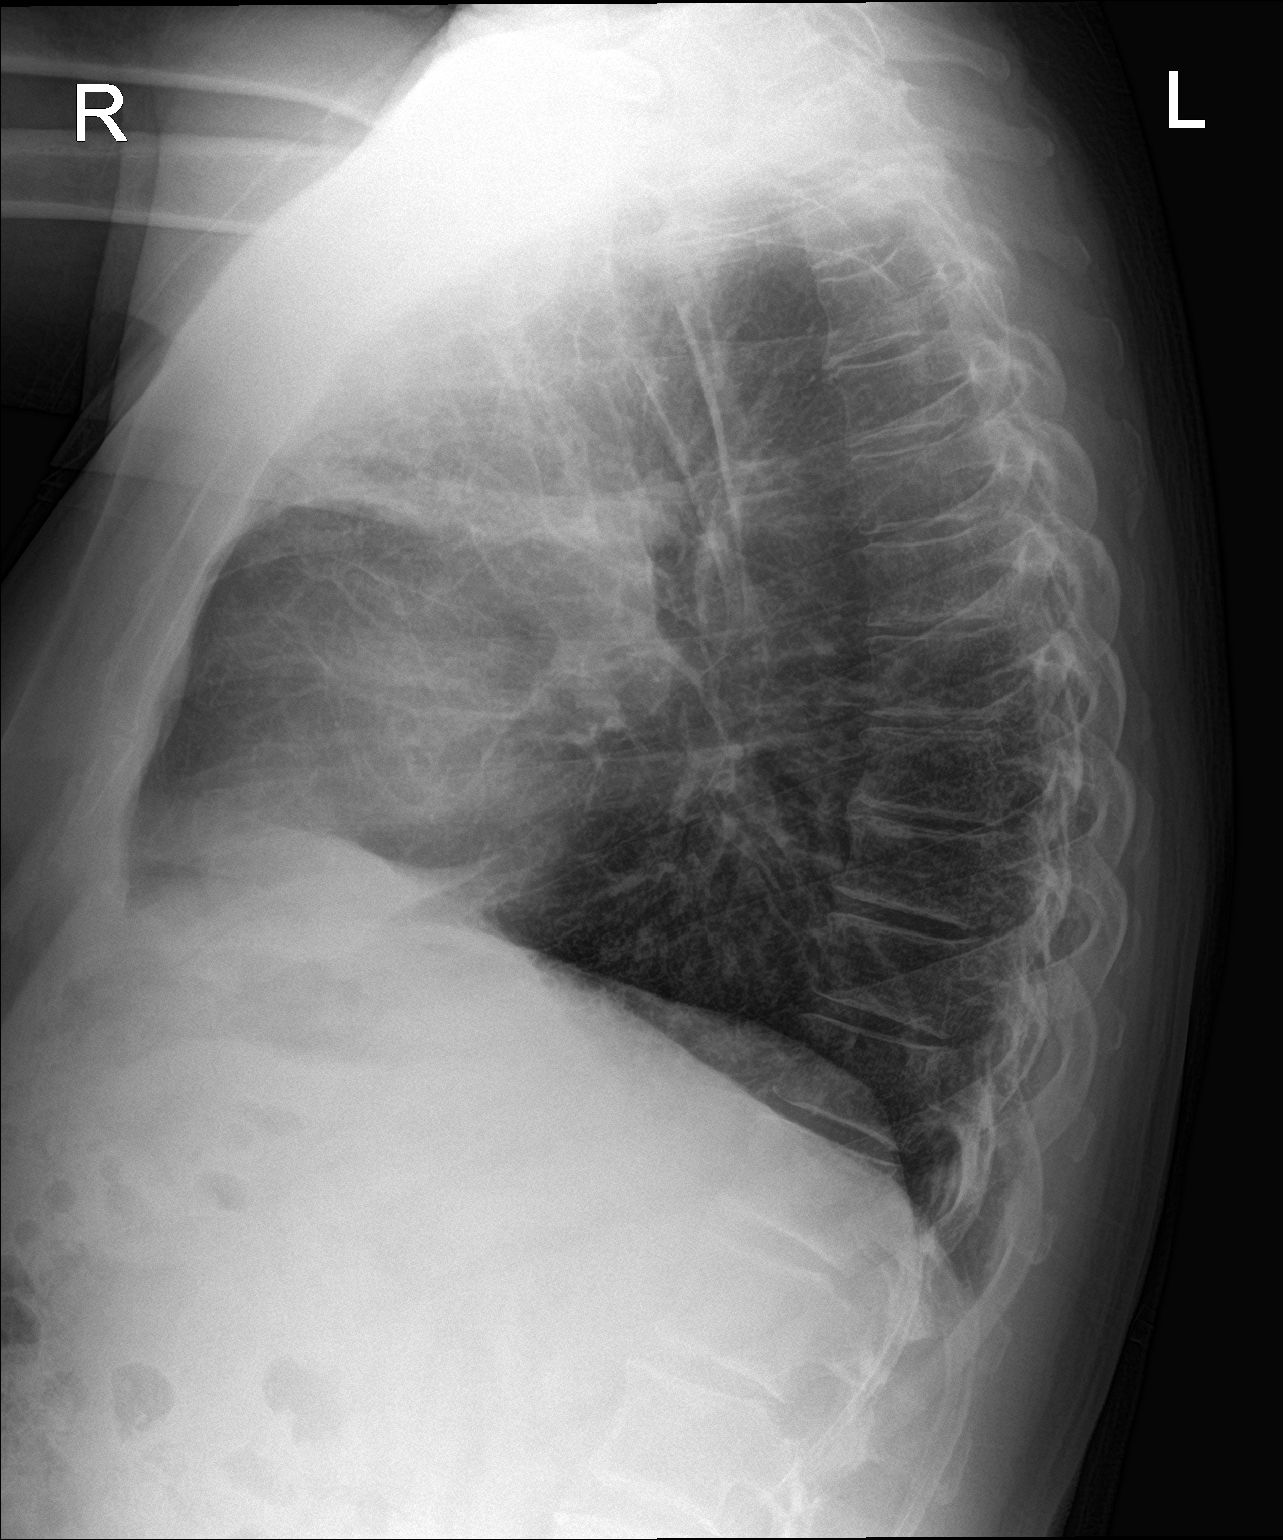

[2 of 2 positions shown; findings below may reference images not displayed]

FINDINGS: There is some strandy and patchy opacities in the left lower lung
and lateral right mid lung. There is no pleural effusion or
pneumothorax. The cardiomediastinal silhouette is within normal
limits. No acute fractures are seen.
IMPRESSION: 1. There are minimal strandy and patchy opacities in the left lower
lung and lateral right mid lung, indeterminate. Findings may be
related to infectious/inflammatory process. Followup PA and lateral
chest X-ray is recommended in 3-4 weeks following trial of
antibiotic therapy to ensure resolution and exclude underlying
malignancy.
